# Patient Record
Sex: Female | Born: 1965 | Race: White | Hispanic: No | Marital: Married | Smoking: Never smoker
Health system: Southern US, Community
[De-identification: ages and names within clinical notes are randomized; demographics above are authoritative.]

## PROBLEM LIST (undated history)

## (undated) DIAGNOSIS — M1990 Unspecified osteoarthritis, unspecified site: Secondary | ICD-10-CM

## (undated) DIAGNOSIS — Z9889 Other specified postprocedural states: Secondary | ICD-10-CM

## (undated) DIAGNOSIS — R112 Nausea with vomiting, unspecified: Secondary | ICD-10-CM

## (undated) DIAGNOSIS — M5432 Sciatica, left side: Secondary | ICD-10-CM

## (undated) DIAGNOSIS — F419 Anxiety disorder, unspecified: Secondary | ICD-10-CM

## (undated) DIAGNOSIS — E669 Obesity, unspecified: Secondary | ICD-10-CM

## (undated) DIAGNOSIS — G5600 Carpal tunnel syndrome, unspecified upper limb: Secondary | ICD-10-CM

## (undated) DIAGNOSIS — I10 Essential (primary) hypertension: Secondary | ICD-10-CM

## (undated) HISTORY — DX: Sciatica, left side: M54.32

## (undated) HISTORY — PX: FRACTURE SURGERY: SHX138

## (undated) HISTORY — DX: Carpal tunnel syndrome, unspecified upper limb: G56.00

## (undated) HISTORY — DX: Obesity, unspecified: E66.9

## (undated) HISTORY — DX: Essential (primary) hypertension: I10

---
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
## 1993-06-02 HISTORY — PX: OTHER SURGICAL HISTORY: SHX169

## 1997-06-02 HISTORY — PX: OTHER SURGICAL HISTORY: SHX169

## 1997-12-31 ENCOUNTER — Inpatient Hospital Stay (HOSPITAL_COMMUNITY): Admission: AD | Admit: 1997-12-31 | Discharge: 1997-12-31 | Payer: Self-pay | Admitting: Obstetrics and Gynecology

## 1998-01-19 ENCOUNTER — Other Ambulatory Visit: Admission: RE | Admit: 1998-01-19 | Discharge: 1998-01-19 | Payer: Self-pay | Admitting: Obstetrics & Gynecology

## 1999-05-14 ENCOUNTER — Other Ambulatory Visit: Admission: RE | Admit: 1999-05-14 | Discharge: 1999-05-14 | Payer: Self-pay | Admitting: Obstetrics & Gynecology

## 1999-09-09 ENCOUNTER — Ambulatory Visit (HOSPITAL_COMMUNITY): Admission: RE | Admit: 1999-09-09 | Discharge: 1999-09-09 | Payer: Self-pay | Admitting: Obstetrics and Gynecology

## 1999-09-09 ENCOUNTER — Encounter: Payer: Self-pay | Admitting: Obstetrics and Gynecology

## 1999-09-15 ENCOUNTER — Inpatient Hospital Stay (HOSPITAL_COMMUNITY): Admission: AD | Admit: 1999-09-15 | Discharge: 1999-09-15 | Payer: Self-pay | Admitting: Obstetrics and Gynecology

## 1999-11-29 ENCOUNTER — Inpatient Hospital Stay (HOSPITAL_COMMUNITY): Admission: AD | Admit: 1999-11-29 | Discharge: 1999-12-02 | Payer: Self-pay | Admitting: Obstetrics & Gynecology

## 1999-12-06 ENCOUNTER — Encounter: Admission: RE | Admit: 1999-12-06 | Discharge: 1999-12-31 | Payer: Self-pay | Admitting: Obstetrics & Gynecology

## 2000-01-02 ENCOUNTER — Other Ambulatory Visit: Admission: RE | Admit: 2000-01-02 | Discharge: 2000-01-02 | Payer: Self-pay | Admitting: Obstetrics & Gynecology

## 2002-06-02 HISTORY — PX: VAGINAL HYSTERECTOMY: SUR661

## 2002-09-15 ENCOUNTER — Ambulatory Visit (HOSPITAL_COMMUNITY): Admission: RE | Admit: 2002-09-15 | Discharge: 2002-09-15 | Payer: Self-pay | Admitting: Obstetrics & Gynecology

## 2003-03-20 ENCOUNTER — Other Ambulatory Visit: Admission: RE | Admit: 2003-03-20 | Discharge: 2003-03-20 | Payer: Self-pay | Admitting: Obstetrics & Gynecology

## 2003-06-15 ENCOUNTER — Observation Stay (HOSPITAL_COMMUNITY): Admission: RE | Admit: 2003-06-15 | Discharge: 2003-06-16 | Payer: Self-pay | Admitting: Obstetrics & Gynecology

## 2004-11-26 ENCOUNTER — Other Ambulatory Visit: Admission: RE | Admit: 2004-11-26 | Discharge: 2004-11-26 | Payer: Self-pay | Admitting: Obstetrics & Gynecology

## 2005-09-16 ENCOUNTER — Ambulatory Visit: Payer: Self-pay

## 2006-06-02 HISTORY — PX: CARPAL TUNNEL RELEASE: SHX101

## 2012-04-23 ENCOUNTER — Ambulatory Visit (INDEPENDENT_AMBULATORY_CARE_PROVIDER_SITE_OTHER): Payer: 59 | Admitting: Internal Medicine

## 2012-04-23 ENCOUNTER — Encounter: Payer: Self-pay | Admitting: Internal Medicine

## 2012-04-23 VITALS — BP 157/94 | HR 77 | Temp 98.7°F | Ht 67.0 in | Wt 299.0 lb

## 2012-04-23 DIAGNOSIS — R5383 Other fatigue: Secondary | ICD-10-CM

## 2012-04-23 DIAGNOSIS — R5381 Other malaise: Secondary | ICD-10-CM

## 2012-04-23 DIAGNOSIS — Z23 Encounter for immunization: Secondary | ICD-10-CM

## 2012-04-23 DIAGNOSIS — I10 Essential (primary) hypertension: Secondary | ICD-10-CM

## 2012-04-23 DIAGNOSIS — Z139 Encounter for screening, unspecified: Secondary | ICD-10-CM

## 2012-04-23 LAB — TSH: TSH: 1.11 u[IU]/mL (ref 0.35–5.50)

## 2012-04-23 LAB — COMPREHENSIVE METABOLIC PANEL
ALT: 17 U/L (ref 0–35)
AST: 22 U/L (ref 0–37)
Albumin: 4.3 g/dL (ref 3.5–5.2)
Alkaline Phosphatase: 70 U/L (ref 39–117)
BUN: 17 mg/dL (ref 6–23)
CO2: 28 mEq/L (ref 19–32)
Calcium: 9.3 mg/dL (ref 8.4–10.5)
Chloride: 99 mEq/L (ref 96–112)
Creatinine, Ser: 0.9 mg/dL (ref 0.4–1.2)
GFR: 75.58 mL/min (ref >60.00)
Glucose, Bld: 84 mg/dL (ref 70–99)
Potassium: 3.2 mEq/L — ABNORMAL LOW (ref 3.5–5.1)
Sodium: 138 mEq/L (ref 135–145)
Total Bilirubin: 0.6 mg/dL (ref 0.3–1.2)
Total Protein: 8 g/dL (ref 6.0–8.3)

## 2012-04-23 LAB — CBC WITH DIFFERENTIAL/PLATELET
Basophils Absolute: 0.1 10*3/uL (ref 0.0–0.1)
Basophils Relative: 0.8 % (ref 0.0–3.0)
Eosinophils Absolute: 0.5 10*3/uL (ref 0.0–0.7)
Eosinophils Relative: 6.1 % — ABNORMAL HIGH (ref 0.0–5.0)
HCT: 39.7 % (ref 36.0–46.0)
Hemoglobin: 12.9 g/dL (ref 12.0–15.0)
Lymphocytes Relative: 31.2 % (ref 12.0–46.0)
Lymphs Abs: 2.5 10*3/uL (ref 0.7–4.0)
MCHC: 32.5 g/dL (ref 30.0–36.0)
MCV: 86.1 fl (ref 78.0–100.0)
Monocytes Absolute: 0.3 10*3/uL (ref 0.1–1.0)
Monocytes Relative: 3.8 % (ref 3.0–12.0)
Neutro Abs: 4.7 10*3/uL (ref 1.4–7.7)
Neutrophils Relative %: 58.1 % (ref 43.0–77.0)
Platelets: 282 10*3/uL (ref 150.0–400.0)
RBC: 4.61 Mil/uL (ref 3.87–5.11)
RDW: 14.1 % (ref 11.5–14.6)
WBC: 8.1 10*3/uL (ref 4.5–10.5)

## 2012-04-23 LAB — LIPID PANEL
Cholesterol: 207 mg/dL — ABNORMAL HIGH (ref 0–200)
HDL: 44.7 mg/dL (ref >39.00)
Total CHOL/HDL Ratio: 5
Triglycerides: 130 mg/dL (ref 0.0–149.0)
VLDL: 26 mg/dL (ref 0.0–40.0)

## 2012-04-23 LAB — LDL CHOLESTEROL, DIRECT: Direct LDL: 147.8 mg/dL

## 2012-04-23 MED ORDER — HYDROCHLOROTHIAZIDE 25 MG PO TABS: 25.0000 mg | ORAL_TABLET | Freq: Every day | ORAL | Status: DC

## 2012-04-23 NOTE — Patient Instructions (Addendum)
It was nice meeting you today.  I am going to have you continue to take the HCTZ for now.  We will monitor your blood pressure.  Send me in some readings over the next few weeks.  Let me know if any problems.

## 2012-04-24 ENCOUNTER — Encounter: Payer: Self-pay | Admitting: Internal Medicine

## 2012-04-24 DIAGNOSIS — I10 Essential (primary) hypertension: Secondary | ICD-10-CM | POA: Insufficient documentation

## 2012-04-24 NOTE — Progress Notes (Signed)
  Subjective:    Patient ID: Jenny Stephenson, female    DOB: 05/13/1966, 46 y.o.   MRN: [id number]  HPI 46 year old female with past history of hypertension (recently diagnosed) who comes in today to follow up regarding her blood pressure and to establish care.  She gets her GYN care at [facility].  Up to date with her pelvic and pap smears.  Sees Dr. Varney Baas.  Due to follow up in 1/13.  Has had problems with her blood pressure.  Borderline for a while.  Recently noticed some swelling in her fingers.  Went to acute care.  Blood pressure 160/98.  Recheck prior to leaving 148/90. Labs ok.  Started on HCTZ.  She took it regularly initially, but has subsequently stopped if for a while.  She restarted it approximately one week ago.  Blood pressures recently averaging 120s/70s.    Past Medical History  Diagnosis Date  . Hypertension     Review of Systems Patient denies any headache, lightheadedness or dizziness.  No sinus or allergy sympoms.  No chest pain or tightness.  Occasional palpitations.  Relates it to increased caffeine.  Has cut down.  No associated symptoms.   No increased shortness of breath, cough or congestion.  No nausea or vomiting.  No abdominal pain or cramping.  No bowel change, such as diarrhea, constipation, BRBPR or melana.  No urine change.   Was told at birth she had a slight murmur.  Had an ECHO previously.  States everything checked out fine.  Not sleeping well.   Increased stress with some family medical issues.  Discussed at length with her.  Discussed medication and counseling.       Objective:   Physical Exam Filed Vitals:   04/23/12 0927  BP: 157/94  Pulse: 77  Temp: 98.7 F (51.46 C)   46 year old female in no acute distress.   HEENT:  Nares - clear.  OP- without lesions or erythema.  NECK:  Supple, nontender.  No audible bruit.   HEART:  Appears to be regular.  I/VI systolic murmur.   LUNGS:  Without crackles or wheezing audible.  Respirations even  and unlabored.   RADIAL PULSE:  Equal bilaterally.  ABDOMEN:  Soft, nontender.  No audible abdominal bruit.   EXTREMITIES:  No increased edema to be present.                     Assessment & Plan:  DIFFICULTY SLEEPING.  Discussed with her at length.  Increased stress and worrying.  States her mind doesn't shut down.  Discussed using Prozac daily.  She has taken this previously and tolerated. Discussed coming off the Benadryl.  She declines the Prozac at this time.  Wants to monitor. Will follow.  Discussed counseling.  Declines.  Does not feel she needs anything more at this time.   FATIGUE.  May be multifactorial.  Check cbc,met c and tsh.    GYN.  Sees Dr.  Varney Baas at [facility].  Up to date.   HEALTH MAINTENANCE.  Up to date with her gyn exams, breast exams and mammograms.  Due to see him in 1/14.  Discussed need for colonoscopy given her mothers history of polyps.  She will notify me when agreeable.  Check lipid profile.

## 2012-04-24 NOTE — Assessment & Plan Note (Signed)
Blood pressure as outlined.  Continue HCTZ.  Check metabolic panel.  Her cuff correlates.  Have her spot check her pressure.

## 2012-04-28 ENCOUNTER — Telehealth: Payer: Self-pay | Admitting: Internal Medicine

## 2012-04-28 DIAGNOSIS — E876 Hypokalemia: Secondary | ICD-10-CM

## 2012-04-28 MED ORDER — TRIAMTERENE-HCTZ 37.5-25 MG PO TABS: 1.0000 | ORAL_TABLET | Freq: Every day | ORAL | Status: DC

## 2012-04-28 NOTE — Telephone Encounter (Signed)
Pt was notified of labs.  Her potassium was low.  Will stop hctz and start triam/hctz 37.5/25 q day.  Recheck potassium 05/10/12.  Also will work on low chol diet and exercise. Will follow.

## 2012-05-10 ENCOUNTER — Other Ambulatory Visit: Payer: 59

## 2012-06-03 ENCOUNTER — Other Ambulatory Visit (INDEPENDENT_AMBULATORY_CARE_PROVIDER_SITE_OTHER): Payer: 59

## 2012-06-03 DIAGNOSIS — E876 Hypokalemia: Secondary | ICD-10-CM

## 2012-06-03 LAB — POTASSIUM: Potassium: 4 mEq/L (ref 3.5–5.1)

## 2012-06-04 ENCOUNTER — Telehealth: Payer: Self-pay | Admitting: Internal Medicine

## 2012-06-04 NOTE — Telephone Encounter (Signed)
Pt notified of lab results via my chart messaging.

## 2012-06-07 ENCOUNTER — Ambulatory Visit (INDEPENDENT_AMBULATORY_CARE_PROVIDER_SITE_OTHER): Payer: 59 | Admitting: Internal Medicine

## 2012-06-07 ENCOUNTER — Encounter: Payer: Self-pay | Admitting: Internal Medicine

## 2012-06-07 ENCOUNTER — Other Ambulatory Visit: Payer: 59

## 2012-06-07 VITALS — BP 138/84 | HR 87 | Temp 99.0°F | Ht 67.0 in | Wt 293.8 lb

## 2012-06-07 DIAGNOSIS — E7800 Pure hypercholesterolemia, unspecified: Secondary | ICD-10-CM | POA: Insufficient documentation

## 2012-06-07 DIAGNOSIS — I10 Essential (primary) hypertension: Secondary | ICD-10-CM

## 2012-06-10 ENCOUNTER — Encounter: Payer: Self-pay | Admitting: Internal Medicine

## 2012-06-11 ENCOUNTER — Telehealth: Payer: Self-pay | Admitting: Internal Medicine

## 2012-06-11 MED ORDER — AMOXICILLIN 875 MG PO TABS: 875.0000 mg | ORAL_TABLET | Freq: Two times a day (BID) | ORAL | Status: DC

## 2012-06-11 NOTE — Telephone Encounter (Signed)
Confirm no abx allergies.  Confirm she can take penicillin. If she can and has no abx - then amoxicillin 875mg  bid x 10 days.  Let me know if persistent problems.

## 2012-06-11 NOTE — Telephone Encounter (Signed)
Called prescription in to pharmacy 

## 2012-06-13 ENCOUNTER — Encounter: Payer: Self-pay | Admitting: Internal Medicine

## 2012-06-13 NOTE — Assessment & Plan Note (Signed)
Since blood pressure has done well off medication, will have her stop the triam/hctz and follow her pressures.  If it starts to elevate - she is to notify me.  Any problems, let me know.

## 2012-06-13 NOTE — Progress Notes (Signed)
  Subjective:    Patient ID: Jenny Stephenson, female    DOB: 19-Aug-1965, 47 y.o.   MRN: [id number]  HPI 47 year old female with past history of hypertension and hypercholesterolemia who comes in today for a scheduled follow up.  She states she is doing well.  She does report that starting within the last 24 hours - she noticed some sore throat and minimal headache.  Some head congestion.  Started taking Zyrtec.  No cough or chest congestion.  Her blood pressure has been doing well.   She stopped her medication and blood pressure stayed in the 116-125/70s range.  Her cuff does correlate.  Overall she has been doing well.  Adjusting her diet.    Past Medical History  Diagnosis Date  . Hypertension     Review of Systems Patient denies any headache, lightheadedness or dizziness.  Some congestion and sore throat as outlined.   No chest pain, tightness or palpitations.  No increased shortness of breath, cough or congestion.  No nausea or vomiting.  No abdominal pain or cramping.  No bowel change, such as diarrhea, constipation, BRBPR or melana.  No urine change.        Objective:   Physical Exam Filed Vitals:   06/07/12 1032  BP: 138/84  Pulse: 87  Temp: 99 F (37.2 C)   Pulse 75  47 year old female in no acute distress.   HEENT:  Nares - clear.  OP- without lesions or erythema.  NECK:  Supple, nontender.  No audible bruit.   HEART:  Appears to be regular. LUNGS:  Without crackles or wheezing audible.  Respirations even and unlabored.   RADIAL PULSE:  Equal bilaterally.  ABDOMEN:  Soft, nontender.  No audible abdominal bruit.   EXTREMITIES:  No increased edema to be present.                    Assessment & Plan:  PROBABLE ALLERGIES.  Treat with saline nasal spray as directed.  Plain robitussin as directed.  Notify me if any worsening symptoms or problems.    HEALTH MAINTENANCE.  See last note.  Sees GYN.

## 2012-06-13 NOTE — Assessment & Plan Note (Signed)
Working on diet, exercise and weight loss.  Follow.  Check lipid panel prior to next appt.

## 2012-09-27 ENCOUNTER — Other Ambulatory Visit: Payer: 59

## 2012-10-04 ENCOUNTER — Ambulatory Visit: Payer: 59 | Admitting: Internal Medicine

## 2013-04-07 ENCOUNTER — Other Ambulatory Visit: Payer: Self-pay

## 2013-09-15 ENCOUNTER — Encounter: Payer: Self-pay | Admitting: *Deleted

## 2013-12-20 ENCOUNTER — Telehealth: Payer: Self-pay | Admitting: Internal Medicine

## 2013-12-20 NOTE — Telephone Encounter (Signed)
Pt called to schedule appt with Dr. Nicki Reaper for chest discomfort. Dr. Nicki Reaper didn't have any available appts today. Pt was given the option of being seen by Raquel. Pt stated that she only wants to see Dr. Nicki Reaper and she doesn't see why each time she calls to schedule an appt that Dr. Nicki Reaper doesn't have any available appts. Pt was ask to hold on so she can speak with the triage to see if something could be worked out. Pt hung up/msn

## 2013-12-20 NOTE — Telephone Encounter (Signed)
Spoke with pt, she has already been seen in the Urgent Care.

## 2013-12-20 NOTE — Telephone Encounter (Signed)
Please advise 

## 2013-12-20 NOTE — Telephone Encounter (Signed)
Please see Dr Nicki Reaper' s note and call pt.  Thank you

## 2013-12-20 NOTE — Telephone Encounter (Signed)
I reviewed pts chart.  She established care with me in 11/13 and had a f/u appt with me in 1/14.  No showed for her next appt on 5/14.  There is no documentation that I can find of her trying to get an appt with me and being told she could not be seen.  If she is having chest pain/discomfort, I do think she needs to be seen today.  I recommend (pending symptoms) acute care or ER where stat labs can be done if needed.  Then can f/u with me .    Need to confirm nothing more acute going on.  Thanks.

## 2014-02-27 ENCOUNTER — Ambulatory Visit (INDEPENDENT_AMBULATORY_CARE_PROVIDER_SITE_OTHER): Payer: 59 | Admitting: Internal Medicine

## 2014-02-27 ENCOUNTER — Encounter: Payer: Self-pay | Admitting: Internal Medicine

## 2014-02-27 VITALS — BP 128/80 | HR 69 | Temp 98.0°F | Ht 67.0 in | Wt 299.5 lb

## 2014-02-27 DIAGNOSIS — F411 Generalized anxiety disorder: Secondary | ICD-10-CM

## 2014-02-27 DIAGNOSIS — F419 Anxiety disorder, unspecified: Secondary | ICD-10-CM

## 2014-02-27 DIAGNOSIS — R209 Unspecified disturbances of skin sensation: Secondary | ICD-10-CM

## 2014-02-27 DIAGNOSIS — I10 Essential (primary) hypertension: Secondary | ICD-10-CM

## 2014-02-27 DIAGNOSIS — R200 Anesthesia of skin: Secondary | ICD-10-CM | POA: Insufficient documentation

## 2014-02-27 DIAGNOSIS — E7800 Pure hypercholesterolemia, unspecified: Secondary | ICD-10-CM

## 2014-02-27 LAB — COMPREHENSIVE METABOLIC PANEL
ALBUMIN: 3.9 g/dL (ref 3.5–5.2)
ALT: 27 U/L (ref 0–35)
AST: 21 U/L (ref 0–37)
Alkaline Phosphatase: 66 U/L (ref 39–117)
BUN: 11 mg/dL (ref 6–23)
CALCIUM: 8.5 mg/dL (ref 8.4–10.5)
CHLORIDE: 101 meq/L (ref 96–112)
CO2: 25 meq/L (ref 19–32)
CREATININE: 0.6 mg/dL (ref 0.4–1.2)
GFR: 105.43 mL/min (ref >60.00)
GLUCOSE: 88 mg/dL (ref 70–99)
POTASSIUM: 3.5 meq/L (ref 3.5–5.1)
Sodium: 134 mEq/L — ABNORMAL LOW (ref 135–145)
Total Bilirubin: 0.7 mg/dL (ref 0.2–1.2)
Total Protein: 6.7 g/dL (ref 6.0–8.3)

## 2014-02-27 LAB — LIPID PANEL
CHOLESTEROL: 216 mg/dL — AB (ref 0–200)
HDL: 46 mg/dL (ref >39.00)
LDL CALC: 144 mg/dL — AB (ref 0–99)
NonHDL: 170
TRIGLYCERIDES: 131 mg/dL (ref 0.0–149.0)
Total CHOL/HDL Ratio: 5
VLDL: 26.2 mg/dL (ref 0.0–40.0)

## 2014-02-27 LAB — CBC WITH DIFFERENTIAL/PLATELET
BASOS PCT: 0.3 % (ref 0.0–3.0)
Basophils Absolute: 0 10*3/uL (ref 0.0–0.1)
EOS PCT: 4.4 % (ref 0.0–5.0)
Eosinophils Absolute: 0.4 10*3/uL (ref 0.0–0.7)
HCT: 37.8 % (ref 36.0–46.0)
HEMOGLOBIN: 12.4 g/dL (ref 12.0–15.0)
LYMPHS PCT: 29.8 % (ref 12.0–46.0)
Lymphs Abs: 2.5 10*3/uL (ref 0.7–4.0)
MCHC: 32.9 g/dL (ref 30.0–36.0)
MCV: 84.8 fl (ref 78.0–100.0)
MONOS PCT: 5.6 % (ref 3.0–12.0)
Monocytes Absolute: 0.5 10*3/uL (ref 0.1–1.0)
NEUTROS ABS: 5.1 10*3/uL (ref 1.4–7.7)
NEUTROS PCT: 59.9 % (ref 43.0–77.0)
Platelets: 258 10*3/uL (ref 150.0–400.0)
RBC: 4.45 Mil/uL (ref 3.87–5.11)
RDW: 14.7 % (ref 11.5–15.5)
WBC: 8.4 10*3/uL (ref 4.0–10.5)

## 2014-02-27 LAB — VITAMIN B12: VITAMIN B 12: 315 pg/mL (ref 211–911)

## 2014-02-27 LAB — TSH: TSH: 3.07 u[IU]/mL (ref 0.35–4.50)

## 2014-02-27 MED ORDER — ALPRAZOLAM 0.25 MG PO TABS: 0.2500 mg | ORAL_TABLET | Freq: Two times a day (BID) | ORAL | Status: DC | PRN

## 2014-02-27 NOTE — Progress Notes (Signed)
  Subjective:    Patient ID: Jenny Stephenson, female    DOB: January 24, 1966, 48 y.o.   MRN: [id number]  HPI 48 year old female with past history of hypertension and hypercholesterolemia who comes in today as a work in with concerns regarding some burning in her feet and anxiety issues.  She states that starting one month ago she developed low back pain.  Went to acute care and was diagnosed with sciatica.  At that time was having pain in her low back and left leg.  Was given prednisone, tramadol and gabapentin.  She took the prednisone.  Helped some.  Has been taking the tramadol prn.  Not taking the gabapentin.  Given persistent problems, she went to the chiropractor.  MRI ordered and revealed degenerative disc disease.  She reports that starting last week, noticed increased burning in her feet (bottom of her feet and toes).  The discomfort in her hips and upper legs is worse with sitting. No headaches.  No dizziness.  No vision change.  Increased stress related to this.  She is very anxious.  Concerned she has MS.  Not sleeping well.     Past Medical History  Diagnosis Date  . Hypertension     Outpatient Encounter Prescriptions as of 02/27/2014  Medication Sig  . diphenhydrAMINE (BENADRYL) 25 mg capsule Take 25 mg by mouth at bedtime as needed.   . traMADol (ULTRAM) 50 MG tablet Take by mouth every 6 (six) hours as needed.  . ALPRAZolam (XANAX) 0.25 MG tablet Take 1 tablet (0.25 mg total) by mouth 2 (two) times daily as needed for anxiety.  . [DISCONTINUED] amoxicillin (AMOXIL) 875 MG tablet Take 1 tablet (875 mg total) by mouth 2 (two) times daily.    Review of Systems Patient denies any headache, lightheadedness or dizziness.  No sinus congestion.  No chest pain, tightness or palpitations.  No increased shortness of breath, cough or congestion.  No nausea or vomiting.  No abdominal pain or cramping.  Low back pain and hip pain with left leg pain as outlined.  Numbness and tingling of feet and toes  as outlined.  Increased stress and anxiety.  Not sleeping.         Objective:   Physical Exam  Filed Vitals:   02/27/14 0809  BP: 128/80  Pulse: 69  Temp: 98 F (36.7 C)   Blood pressure recheck:  27/29-9  48 year old female in no acute distress.   NECK:  Supple, nontender.  No audible bruit.   HEART:  Appears to be regular. LUNGS:  Without crackles or wheezing audible.  Respirations even and unlabored.   RADIAL PULSE:  Equal bilaterally.  ABDOMEN:  Soft, nontender.  No audible abdominal bruit.   EXTREMITIES:  No increased edema to be present.   NEURO:  Sensation intact to pin prick and light touch bilateral lower extremities.  Motor strength 5/5 bilateral lower extremities.  Dorsiflexion and plantar flexion intact.                    Assessment & Plan:  HEALTH MAINTENANCE.  See last note.  Sees GYN.  Up to date with breast, pelvic and pap smears.  Up to date with mammogram.    I spent 25 minutes with the patient and more than 50% if the time was spent in consultation regarding the above.

## 2014-02-27 NOTE — Progress Notes (Signed)
Pre visit review using our clinic review tool, if applicable. No additional management support is needed unless otherwise documented below in the visit note. 

## 2014-02-28 ENCOUNTER — Other Ambulatory Visit: Payer: Self-pay | Admitting: Internal Medicine

## 2014-02-28 ENCOUNTER — Encounter: Payer: Self-pay | Admitting: Internal Medicine

## 2014-02-28 ENCOUNTER — Encounter: Payer: Self-pay | Admitting: Neurology

## 2014-02-28 ENCOUNTER — Encounter: Payer: Self-pay | Admitting: *Deleted

## 2014-02-28 ENCOUNTER — Ambulatory Visit (INDEPENDENT_AMBULATORY_CARE_PROVIDER_SITE_OTHER): Payer: 59 | Admitting: Neurology

## 2014-02-28 VITALS — BP 128/90 | HR 68 | Ht 68.0 in | Wt 298.0 lb

## 2014-02-28 DIAGNOSIS — M5432 Sciatica, left side: Secondary | ICD-10-CM | POA: Insufficient documentation

## 2014-02-28 DIAGNOSIS — R209 Unspecified disturbances of skin sensation: Secondary | ICD-10-CM

## 2014-02-28 DIAGNOSIS — M5430 Sciatica, unspecified side: Secondary | ICD-10-CM

## 2014-02-28 DIAGNOSIS — F419 Anxiety disorder, unspecified: Secondary | ICD-10-CM | POA: Insufficient documentation

## 2014-02-28 DIAGNOSIS — E871 Hypo-osmolality and hyponatremia: Secondary | ICD-10-CM

## 2014-02-28 DIAGNOSIS — R200 Anesthesia of skin: Secondary | ICD-10-CM

## 2014-02-28 HISTORY — DX: Sciatica, left side: M54.32

## 2014-02-28 NOTE — Progress Notes (Signed)
Order placed for f/u sodium.  ?

## 2014-02-28 NOTE — Assessment & Plan Note (Addendum)
Back, left hip and left leg pain as outlined.  Now with burning in her feet.  Will check routine labs including B12, thyroid and cbc.  She already has an appointment scheduled with Dr Jannifer Franklin tomorrow.  We discussed possible etiologies.  Will start with routine blood work first.  She will see him tomorrow for further w/up (including possible NCS, etc).  MRI as outlined.  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  Has been to the chiropractor.  May need physical therapy.  She has gabapentin.  We discussed a trial of this to see if symptoms improve.  She prefers to discuss with neurology tomorrow prior to starting.

## 2014-02-28 NOTE — Assessment & Plan Note (Signed)
Blood pressure as outlined.  Slightly increased.  Probably related to the increased stress and anxiety.  Will follow.  Have her spot check her pressure.  Get her back in soon to reassess.  Check metabolic panel.

## 2014-02-28 NOTE — Assessment & Plan Note (Signed)
Discussed at length with her today.  Discussed various treatment options.  She has taken xanax previously and tolerated.  We discussed possible SSRI therapy.  Will start with xanax first.  rx given.  Use as directed.  Get her back in soon to reassess.  Further treatment pending response.

## 2014-02-28 NOTE — Assessment & Plan Note (Signed)
Have not seen her in more than one year.  Check cholesterol panel.  Diet and exercise.

## 2014-02-28 NOTE — Patient Instructions (Signed)
Sciatica °Sciatica is pain, weakness, numbness, or tingling along your sciatic nerve. The nerve starts in the lower back and runs down the back of each leg. Nerve damage or certain conditions pinch or put pressure on the sciatic nerve. This causes the pain, weakness, and other discomforts of sciatica. °HOME CARE  °· Only take medicine as told by your doctor. °· Apply ice to the affected area for 20 minutes. Do this 3-4 times a day for the first 48-72 hours. Then try heat in the same way. °· Exercise, stretch, or do your usual activities if these do not make your pain worse. °· Go to physical therapy as told by your doctor. °· Keep all doctor visits as told. °· Do not wear high heels or shoes that are not supportive. °· Get a firm mattress if your mattress is too soft to lessen pain and discomfort. °GET HELP RIGHT AWAY IF:  °· You cannot control when you poop (bowel movement) or pee (urinate). °· You have more weakness in your lower back, lower belly (pelvis), butt (buttocks), or legs. °· You have redness or puffiness (swelling) of your back. °· You have a burning feeling when you pee. °· You have pain that gets worse when you lie down. °· You have pain that wakes you from your sleep. °· Your pain is worse than past pain. °· Your pain lasts longer than 4 weeks. °· You are suddenly losing weight without reason. °MAKE SURE YOU:  °· Understand these instructions. °· Will watch this condition. °· Will get help right away if you are not doing well or get worse. °Document Released: 02/26/2008 Document Revised: 11/18/2011 Document Reviewed: 09/28/2011 °Exit[employer]® Patient Information ©2015 [employer]. This information is not intended to replace advice given to you by your health care provider. Make sure you discuss any questions you have with your health care provider. ° °

## 2014-02-28 NOTE — Progress Notes (Signed)
Reason for visit: Numbness  Jenny Stephenson is a 48 y.o. female  History of present illness:  Ms. Jenny Stephenson is a 5 year old right-handed white female with a history of obesity and carpal tunnel syndrome in the past. The patient indicates that she began to have spontaneous onset of back pain that began about one month ago. The pain would radiate down the left leg, and go to the fourth and fifth toes on the left foot. The patient had a burning sensation that would come and go in the foot, and she indicates that the numbness and tingling sensations go down the posterior portion of the legs. She indicates that the sensations tend to come on when she is sitting, better when she is up and moving around. She underwent chiropractic therapy, but then she began having numbness and tingling down both legs. The patient was seen by Dr. Sherwood Gambler, and MRI evaluation of the lumbosacral spine was done, and this is brought for my review. This study is relatively unremarkable, and shows mild degenerative disc disease and facet arthropathy. No evidence of nerve root compression is seen, no evidence of spinal cord abnormalities are noted. The patient denies any weakness of the extremities, and she denies any balance issues. There have been no changes in bowel or bladder control. The patient denies any sensory alteration or pain in the neck or down arms. She comes to this office for an evaluation.  Past Medical History  Diagnosis Date  . Hypertension   . Sciatica of left side 02/28/2014  . Obesity   . Carpal tunnel syndrome     bilateral    Past Surgical History  Procedure Laterality Date  . Tonsillectomy  1973  . Cesarean section  1993 and 2001  . Cholescystectomy  1995  . Arm surgery  1999    screws/plates  . Vaginal hysterectomy  2004    ovaries not removed, secondary to fibroids and bleeding  . Carpal tunnel release  2008    Family History  Problem Relation Age of Onset  . Prostate cancer Father   .  Hypertension Mother   . Ulcerative colitis Mother   . Colon polyps Mother   . Heart disease Maternal Grandmother     myocardial infarction  . Colon cancer Neg Hx   . Breast cancer Neg Hx     Social history:  reports that she has never smoked. She has never used smokeless tobacco. She reports that she does not drink alcohol or use illicit drugs.  Medications:  Current Outpatient Prescriptions on File Prior to Visit  Medication Sig Dispense Refill  . ALPRAZolam (XANAX) 0.25 MG tablet Take 1 tablet (0.25 mg total) by mouth 2 (two) times daily as needed for anxiety.  30 tablet  0  . diphenhydrAMINE (BENADRYL) 25 mg capsule Take 25 mg by mouth at bedtime as needed.       . traMADol (ULTRAM) 50 MG tablet Take by mouth every 6 (six) hours as needed.       No current facility-administered medications on file prior to visit.      Allergies  Allergen Reactions  . Codeine Itching and Other (See Comments)    Hyper    ROS: Out of a complete 14 system review of symptoms, the patient complains only of the following symptoms, and all other reviewed systems are negative.  Numbness Anxiety, insomnia, racing thoughts   Blood pressure 128/90, pulse 68, height 5\' 8"  (1.727 m), weight 298 lb (135.172 kg).  Physical Exam  General: The patient is alert and cooperative at the time of the examination. The patient is markedly obese.  Eyes: Pupils are equal, round, and reactive to light. Discs are flat bilaterally.  Neck: The neck is supple, no carotid bruits are noted.  Respiratory: The respiratory examination is clear.  Cardiovascular: The cardiovascular examination reveals a regular rate and rhythm, no obvious murmurs or rubs are noted.  Neuromuscular: Range of movement of the lumbosacral spine is full. No significant discomfort with palpation of the back is noted. Mild tenderness over the SI joints is noted bilaterally.  Skin: Extremities are without significant edema.  Neurologic  Exam  Mental status: The patient is alert and oriented x 3 at the time of the examination. The patient has apparent normal recent and remote memory, with an apparently normal attention span and concentration ability.  Cranial nerves: Facial symmetry is present. There is good sensation of the face to pinprick and soft touch bilaterally. The strength of the facial muscles and the muscles to head turning and shoulder shrug are normal bilaterally. Speech is well enunciated, no aphasia or dysarthria is noted. Extraocular movements are full. Visual fields are full. The tongue is midline, and the patient has symmetric elevation of the soft palate. No obvious hearing deficits are noted.  Motor: The motor testing reveals 5 over 5 strength of all 4 extremities. Good symmetric motor tone is noted throughout. The patient is able to walk on heels and the toes bilaterally.  Sensory: Sensory testing is intact to pinprick, soft touch, vibration sensation, and position sense on all 4 extremities. No evidence of extinction is noted.  Coordination: Cerebellar testing reveals good finger-nose-finger and heel-to-shin bilaterally.  Gait and station: Gait is normal. Tandem gait is normal. Romberg is negative. No drift is seen.  Reflexes: Deep tendon reflexes are symmetric and normal bilaterally. The ankle jerk reflexes are well-maintained bilaterally. Toes are downgoing bilaterally.   Assessment/Plan:  1. Low back pain, bilateral lower extremity discomfort  The patient has developed some back pain, and some discomfort down both legs without significant abnormalities seen on MRI of the lumbar spine. The patient will be set up for nerve conduction studies of both legs and the right arm, and EMG evaluation of both legs. If this study is unremarkable, MRI evaluation of the thoracic spine and possibly the brain may be done to exclude the possibility of demyelinating disease. The patient will followup for the EMG  evaluation.  Jill Alexanders MD 02/28/2014 7:35 PM  [facility] [address] [place], [place] [address]  Phone [phone] Fax [phone]

## 2014-03-07 ENCOUNTER — Other Ambulatory Visit (INDEPENDENT_AMBULATORY_CARE_PROVIDER_SITE_OTHER): Payer: 59

## 2014-03-07 ENCOUNTER — Encounter: Payer: Self-pay | Admitting: Internal Medicine

## 2014-03-07 DIAGNOSIS — E871 Hypo-osmolality and hyponatremia: Secondary | ICD-10-CM

## 2014-03-07 LAB — SODIUM: Sodium: 135 mEq/L (ref 135–145)

## 2014-03-08 ENCOUNTER — Encounter: Payer: Self-pay | Admitting: Internal Medicine

## 2014-03-08 ENCOUNTER — Encounter: Payer: Self-pay | Admitting: Neurology

## 2014-03-08 ENCOUNTER — Encounter (INDEPENDENT_AMBULATORY_CARE_PROVIDER_SITE_OTHER): Payer: Self-pay | Admitting: Radiology

## 2014-03-08 ENCOUNTER — Ambulatory Visit (INDEPENDENT_AMBULATORY_CARE_PROVIDER_SITE_OTHER): Payer: 59 | Admitting: Neurology

## 2014-03-08 DIAGNOSIS — M5432 Sciatica, left side: Secondary | ICD-10-CM

## 2014-03-08 DIAGNOSIS — R200 Anesthesia of skin: Secondary | ICD-10-CM

## 2014-03-08 DIAGNOSIS — Z0289 Encounter for other administrative examinations: Secondary | ICD-10-CM

## 2014-03-08 DIAGNOSIS — R208 Other disturbances of skin sensation: Secondary | ICD-10-CM

## 2014-03-08 NOTE — Procedures (Signed)
     HISTORY:  Jenny Stephenson is a 48 year old patient with a history of intermittent numbness of the lower extremities, and some low back pain. MRI evaluation of the low back was unrevealing. The patient is being evaluated for a possible neuropathy or a lumbosacral radiculopathy.  NERVE CONDUCTION STUDIES:  Nerve conduction studies were performed on the right upper extremity. The distal motor latencies and motor amplitudes for the median and ulnar nerves were within normal limits. The F wave latencies and nerve conduction velocities for these nerves were also normal. The sensory latencies for the median and ulnar nerves were normal.  Nerve conduction studies were performed on both lower extremities. The distal motor latencies and motor amplitudes for the peroneal and posterior tibial nerves were within normal limits. The nerve conduction velocities for these nerves were also normal. The F wave latencies were normal for the peroneal and posterior tibial nerves bilaterally. The sensory latencies for the peroneal nerves were within normal limits.   EMG STUDIES:  EMG study was performed on the right lower extremity:  The tibialis anterior muscle reveals 2 to 4K motor units with full recruitment. No fibrillations or positive waves were seen. The peroneus tertius muscle reveals 2 to 4K motor units with full recruitment. No fibrillations or positive waves were seen. The medial gastrocnemius muscle reveals 1 to 3K motor units with full recruitment. No fibrillations or positive waves were seen. The vastus lateralis muscle reveals 2 to 4K motor units with full recruitment. No fibrillations or positive waves were seen. The iliopsoas muscle reveals 2 to 4K motor units with full recruitment. No fibrillations or positive waves were seen. The biceps femoris muscle (long head) reveals 2 to 4K motor units with full recruitment. No fibrillations or positive waves were seen. The lumbosacral paraspinal muscles  were tested at 3 levels, and revealed no abnormalities of insertional activity at all 3 levels tested. There was good relaxation.  EMG study was performed on the left lower extremity:  The tibialis anterior muscle reveals 2 to 4K motor units with full recruitment. No fibrillations or positive waves were seen. The peroneus tertius muscle reveals 2 to 4K motor units with full recruitment. No fibrillations or positive waves were seen. The medial gastrocnemius muscle reveals 1 to 3K motor units with full recruitment. No fibrillations or positive waves were seen. The vastus lateralis muscle reveals 2 to 4K motor units with full recruitment. No fibrillations or positive waves were seen. The iliopsoas muscle reveals 2 to 4K motor units with full recruitment. No fibrillations or positive waves were seen. The biceps femoris muscle (long head) reveals 2 to 4K motor units with full recruitment. No fibrillations or positive waves were seen. The lumbosacral paraspinal muscles were tested at 3 levels, and revealed no abnormalities of insertional activity at all 3 levels tested. There was good relaxation.   IMPRESSION:  Nerve conduction studies done on the right upper extremity and both lower extremities were unremarkable. No evidence of a peripheral neuropathy is seen. EMG evaluation of both lower extremities were unremarkable, without evidence of an overlying lumbosacral radiculopathy on either side.  Jill Alexanders MD 03/08/2014 1:56 PM  [facility] [address] [place], [place] [address]  Phone [phone] Fax [phone]

## 2014-03-08 NOTE — Progress Notes (Signed)
Jenny Stephenson is a 48 year old patient with a history of obesity and a history of intermittent paresthesias involving both lower extremities associated with low back pain. The patient is being evaluated for possible radiculopathy.  Nerve conduction studies done on the right arm and both legs were normal, without evidence of a peripheral neuropathy. EMG evaluation of both legs were normal.  The patient will be sent for further evaluation, with blood work today, and she will have MRI evaluation of the thoracic spine and brain to evaluate for possible demyelinating disease. She will followup in 3 months.

## 2014-03-09 LAB — LYME, TOTAL AB TEST/REFLEX: Lyme IgG/IgM Ab: <0.91 {ISR} (ref 0.00–0.90)

## 2014-03-09 LAB — SEDIMENTATION RATE: SED RATE: 7 mm/h (ref 0–32)

## 2014-03-09 LAB — HIV ANTIBODY (ROUTINE TESTING W REFLEX)
HIV 1/HIV 2 AB: NONREACTIVE
HIV 1/O/2 Abs-Index Value: <1 (ref <1.00)

## 2014-03-09 LAB — ANGIOTENSIN CONVERTING ENZYME: Angio Convert Enzyme: 33 U/L (ref 14–82)

## 2014-03-09 LAB — RHEUMATOID FACTOR: Rhuematoid fact SerPl-aCnc: 13.5 IU/mL (ref 0.0–13.9)

## 2014-03-09 LAB — ANA W/REFLEX: ANA: NEGATIVE

## 2014-03-10 ENCOUNTER — Other Ambulatory Visit: Payer: 59

## 2014-03-15 ENCOUNTER — Ambulatory Visit (INDEPENDENT_AMBULATORY_CARE_PROVIDER_SITE_OTHER): Payer: 59

## 2014-03-15 DIAGNOSIS — R208 Other disturbances of skin sensation: Secondary | ICD-10-CM

## 2014-03-15 DIAGNOSIS — R200 Anesthesia of skin: Secondary | ICD-10-CM

## 2014-03-15 DIAGNOSIS — M5432 Sciatica, left side: Secondary | ICD-10-CM

## 2014-03-15 MED ORDER — GADOPENTETATE DIMEGLUMINE 469.01 MG/ML IV SOLN: 20.0000 mL | Freq: Once | INTRAVENOUS | Status: AC | PRN

## 2014-03-16 ENCOUNTER — Encounter: Payer: Self-pay | Admitting: Internal Medicine

## 2014-03-16 ENCOUNTER — Telehealth: Payer: Self-pay | Admitting: Neurology

## 2014-03-16 MED ORDER — TRAMADOL HCL 50 MG PO TABS: 50.0000 mg | ORAL_TABLET | Freq: Four times a day (QID) | ORAL | Status: DC | PRN

## 2014-03-16 NOTE — Telephone Encounter (Signed)
I called patient. MRI of the thoracic spine and brain were unremarkable, no evidence of spinal cord compression or evidence of demyelinating disease. The gabapentin may need to be increased over time, we will followup with the patient. The patient wants a prescription for Ultram, I will call this in.    MRI thoracic spine 03/16/2014:  Impression   Normal MRI thoracic spine (with and without).   MRI brain 03/16/2014:   IMPRESSION:  Normal MRI brain (with and without).

## 2014-03-16 NOTE — Telephone Encounter (Signed)
Called patient and scheduled appointment at 9:30 on 05/17/14 with NP MM.

## 2014-03-21 ENCOUNTER — Telehealth: Payer: Self-pay | Admitting: Internal Medicine

## 2014-03-21 NOTE — Telephone Encounter (Signed)
I can see her at 12:00 on 04/03/14.  Can also hold for cancellation.

## 2014-03-21 NOTE — Telephone Encounter (Signed)
Patient called and said she received the message asking her to come in yesterday to late to make the appointment.  She wants to know if the doctor can choose another appointment and get back to her because coming into the office in the middle of November is too late.   She needs to see the doctor before that.

## 2014-03-21 NOTE — Telephone Encounter (Signed)
See other message for work in.

## 2014-03-21 NOTE — Telephone Encounter (Signed)
This message is in response to a mychart message sent recently asking pt to come in on 10/19 @ 10:30. Pt didn't see the message until later that day. Please advise where to schedule patient

## 2014-03-27 ENCOUNTER — Ambulatory Visit (INDEPENDENT_AMBULATORY_CARE_PROVIDER_SITE_OTHER): Payer: 59 | Admitting: Internal Medicine

## 2014-03-27 ENCOUNTER — Encounter: Payer: Self-pay | Admitting: Internal Medicine

## 2014-03-27 VITALS — BP 130/90 | HR 76 | Temp 98.1°F | Ht 68.0 in | Wt 296.5 lb

## 2014-03-27 DIAGNOSIS — F419 Anxiety disorder, unspecified: Secondary | ICD-10-CM

## 2014-03-27 DIAGNOSIS — R200 Anesthesia of skin: Secondary | ICD-10-CM

## 2014-03-27 DIAGNOSIS — I10 Essential (primary) hypertension: Secondary | ICD-10-CM

## 2014-03-27 DIAGNOSIS — R208 Other disturbances of skin sensation: Secondary | ICD-10-CM

## 2014-03-27 MED ORDER — ALPRAZOLAM 0.25 MG PO TABS: 0.2500 mg | ORAL_TABLET | Freq: Every day | ORAL | Status: DC | PRN

## 2014-03-27 MED ORDER — GABAPENTIN 300 MG PO CAPS: 300.0000 mg | ORAL_CAPSULE | Freq: Three times a day (TID) | ORAL | Status: DC | PRN

## 2014-03-27 NOTE — Progress Notes (Signed)
Pre visit review using our clinic review tool, if applicable. No additional management support is needed unless otherwise documented below in the visit note. 

## 2014-03-27 NOTE — Progress Notes (Signed)
Subjective:    Patient ID: Jenny Stephenson, female    DOB: 04-30-1966, 48 y.o.   MRN: [id number]  HPI 48 year old female with past history of hypertension and hypercholesterolemia who comes in today as a work in with concerns regarding some persistent burning in her feet.  Symptoms started over one month ago.  Saw Dr Jannifer Franklin.  Had normal MRI brain, normal thoracic spine and DDD of lumbar spine.  Still anxious about the etiology.  States her back in not as bad.  Still with burning in her feet.  Feels has worsened.  Taking gabapentin.  Legs more numb when in the recliner and up.  Last week noticed her fingertips were numb.  Left arm is worse than right.  Will feel cold.  Has a lot of questions about why she is now having these symptoms.  Some neck issues.   Some pain with rotation of her head.       Past Medical History  Diagnosis Date  . Hypertension   . Sciatica of left side 02/28/2014  . Obesity   . Carpal tunnel syndrome     bilateral    Outpatient Encounter Prescriptions as of 03/27/2014  Medication Sig  . ALPRAZolam (XANAX) 0.25 MG tablet Take 1 tablet (0.25 mg total) by mouth 2 (two) times daily as needed for anxiety.  . cetirizine (ZYRTEC) 10 MG tablet Take 10 mg by mouth daily as needed.   . diphenhydrAMINE (BENADRYL) 25 mg capsule Take 25 mg by mouth at bedtime as needed.   Marland Kitchen ibuprofen (ADVIL,MOTRIN) 200 MG tablet Take 200 mg by mouth every 6 (six) hours as needed.  . traMADol (ULTRAM) 50 MG tablet Take 1 tablet (50 mg total) by mouth every 6 (six) hours as needed.  . gabapentin (NEURONTIN) 300 MG capsule Take 300 mg by mouth at bedtime.    Review of Systems Patient denies any headache, lightheadedness or dizziness.  Neck pain as outlined.  No chest pain, tightness or palpitations.  No increased shortness of breath, cough or congestion.  No nausea or vomiting.  No abdominal pain or cramping.  Low back pain and hip pain with left leg pain as outlined.  Numbness and tingling of feet  and toes as outlined.  Also, fingertips tingling and upper extremity symptoms as outlined.   Increased stress and anxiety related to these symptoms.           Objective:   Physical Exam  Filed Vitals:   03/27/14 0916  BP: 130/90  Pulse: 76  Temp: 98.1 F (57.59 C)   48 year old female in no acute distress.   NECK:  Supple, nontender.  No audible bruit.   HEART:  Appears to be regular. LUNGS:  Without crackles or wheezing audible.  Respirations even and unlabored.   RADIAL PULSE:  Equal bilaterally.  ABDOMEN:  Soft, nontender.  No audible abdominal bruit.   EXTREMITIES:  No increased edema to be present.   NEURO:  No focal abnormality noted.  See last note.                    Assessment & Plan:  1. Essential hypertension Blood pressure slightly elevated today.  Follow.  Feel related to the above.    2. Numbness in feet Unclear etiology.  The burning is worse.  Left upper extremity bothers her more than right upper extremity.  Seeing neurology.  Has had MRI brain, MRI thoracic and L-S spine.  Had normal NCS.  Will contact Dr Jannifer Franklin to see what further w/up warranted. Pt questioning need of C-spine MRI.    3. Anxiety Increased stress and anxiety related to above.  Refilled xanax.  Follow.    HEALTH MAINTENANCE.  See previous notes.  Sees GYN.  Up to date with breast, pelvic and pap smears.  Up to date with mammogram.    I spent 25 minutes with the patient and more than 50% if the time was spent in consultation regarding the above.

## 2014-04-02 ENCOUNTER — Encounter: Payer: Self-pay | Admitting: Internal Medicine

## 2014-04-03 ENCOUNTER — Ambulatory Visit: Payer: 59 | Admitting: Internal Medicine

## 2014-04-05 ENCOUNTER — Telehealth: Payer: Self-pay | Admitting: Neurology

## 2014-04-05 ENCOUNTER — Encounter: Payer: Self-pay | Admitting: Internal Medicine

## 2014-04-05 NOTE — Telephone Encounter (Signed)
The patient continues to complain of left greater than right upper extremity and bilateral lower extremity paresthesias and discomfort. The patient is having some neck pain. MRI of the brain, and thoracic spine was unremarkable. Dr. Aquilla Hacker obtain MRI of the cervical spine. The patient will follow-up through this office. Blood work was unremarkable.

## 2014-04-12 ENCOUNTER — Ambulatory Visit (INDEPENDENT_AMBULATORY_CARE_PROVIDER_SITE_OTHER): Payer: 59

## 2014-04-12 ENCOUNTER — Ambulatory Visit (INDEPENDENT_AMBULATORY_CARE_PROVIDER_SITE_OTHER): Payer: 59 | Admitting: Podiatry

## 2014-04-12 ENCOUNTER — Encounter: Payer: Self-pay | Admitting: Podiatry

## 2014-04-12 ENCOUNTER — Ambulatory Visit: Payer: Self-pay | Admitting: Internal Medicine

## 2014-04-12 VITALS — BP 141/85 | HR 68 | Resp 16 | Ht 68.0 in | Wt 290.0 lb

## 2014-04-12 DIAGNOSIS — R202 Paresthesia of skin: Secondary | ICD-10-CM

## 2014-04-12 DIAGNOSIS — G629 Polyneuropathy, unspecified: Secondary | ICD-10-CM

## 2014-04-12 DIAGNOSIS — M5430 Sciatica, unspecified side: Secondary | ICD-10-CM

## 2014-04-12 NOTE — Progress Notes (Signed)
   Subjective:    Patient ID: Jenny Stephenson, female    DOB: 22-Feb-1966, 48 y.o.   MRN: [id number]  HPI Comments: i have burning in both feet on the bottom and it started in September. The left foot is worse. i can be sitting and they start burning and if i change position they will get better. In the last week my feet have gotten better. i am taking gabapentin and its helping a little bit. i got dizzy with it over the weekend and i havent taken it since then. i take ibuprofen and tylenol.  Foot Pain Associated symptoms include numbness.      Review of Systems  Musculoskeletal: Positive for back pain.  Neurological: Positive for dizziness and numbness.  Psychiatric/Behavioral: The patient is nervous/anxious.   All other systems reviewed and are negative.      Objective:   Physical Exam:I have reviewed her past history medications allergies surgery social history and review of systems. 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 A history of sciatica. She has had a complete workup neurosurgery to neurology and primary care. States the only time her feet felt better while she was on a steroid which also improved her back pain and her sciatica. Recently she has taken herself off the gabapentin due to side effects.  Pulses are strongly palpable bilateral. Neurologic sensorium is intact per Semmes-Weinstein monofilament. Deep tendon reflexes are intact bilateral and muscle strength +5 over 5 dorsiflexion plantar flexors and inverters and everters all intrinsic musculature is intact. Orthopedic evaluation demonstrates all joints distal to the ankle and full range of motion without crepitation. Cutaneous evaluation demonstrates supple well-hydrated cutis no erythema edema cellulitis drainage or odor.        Assessment & Plan:  Assessment: No reproducible symptoms to the bilateral foot. 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  Plan: I  encouraged her to start back on her gabapentin. And I am referring her to Dr.Gregory Primus Bravo for evaluation of her spine and chronic sciatic pain. Treating this should resolve the burning foot issues. Otherwise long-term prednisone therapy converted to nonsteroidals will be the next step along with a nerve biopsy of the bilateral lower extremities.

## 2014-04-21 ENCOUNTER — Ambulatory Visit: Payer: 59 | Admitting: Internal Medicine

## 2014-04-26 DIAGNOSIS — H8111 Benign paroxysmal vertigo, right ear: Secondary | ICD-10-CM | POA: Insufficient documentation

## 2014-04-26 DIAGNOSIS — H9319 Tinnitus, unspecified ear: Secondary | ICD-10-CM | POA: Insufficient documentation

## 2014-05-17 ENCOUNTER — Ambulatory Visit: Payer: Self-pay | Admitting: Adult Health

## 2014-05-26 ENCOUNTER — Telehealth: Payer: 59 | Admitting: Family

## 2014-05-26 DIAGNOSIS — R059 Cough, unspecified: Secondary | ICD-10-CM

## 2014-05-26 DIAGNOSIS — R05 Cough: Secondary | ICD-10-CM

## 2014-05-26 DIAGNOSIS — J069 Acute upper respiratory infection, unspecified: Secondary | ICD-10-CM

## 2014-05-26 MED ORDER — AZITHROMYCIN 250 MG PO TABS: ORAL_TABLET | ORAL | Status: DC

## 2014-05-26 MED ORDER — BENZONATATE 200 MG PO CAPS: 200.0000 mg | ORAL_CAPSULE | Freq: Three times a day (TID) | ORAL | Status: DC | PRN

## 2014-05-26 NOTE — Progress Notes (Signed)
We are sorry that you are not feeling well.  Here is how we plan to help!  Based on what you have shared with me it looks like you have upper respiratory tract inflammation that has resulted in a signification cough.  Inflammation and infection in the upper respiratory tract is commonly called bronchitis and has four common causes:  Allergies, Viral Infections, Acid Reflux and Bacterial Infections.  Allergies, viruses and acid reflux are treated by controlling symptoms or eliminating the cause. An example might be a cough caused by taking certain blood pressure medications. You stop the cough by changing the medication. Another example might be a cough caused by acid reflux. Controlling the reflux helps control the cough.  Based on your presentation I believe you most likely have A cough due to bacteria.  When patients have a fever and a productive cough with a change in color or increased sputum production, we are concerned about bacterial bronchitis.  If left untreated it can progress to pneumonia.  If your symptoms do not improve with your treatment plan it is important that you contact your provider.   I have prescribed Azithromyin 250 mg: two tables now and then one tablet daily for 4 additonal days   In addition you may use A non-prescription cough medication called Robitussin DAC. Take 2 teaspoons every 8 hours or Delsym: take 2 teaspoons every 12 hours., A non-prescription cough medication called Mucinex DM: take 2 tablets every 12 hours. and A prescription cough medication called Tessalon Perles 100mg. You may take 1-2 capsules every 8 hours as needed for your cough.    HOME CARE . Only take medications as instructed by your medical team. . Complete the entire course of an antibiotic. . Drink plenty of fluids and get plenty of rest. . Avoid close contacts especially the very young and the elderly . Cover your mouth if you cough or cough into your sleeve. . Always remember to wash your  hands . A steam or ultrasonic humidifier can help congestion.    GET HELP RIGHT AWAY IF: . You develop worsening fever. . You become short of breath . You cough up blood. . Your symptoms persist after you have completed your treatment plan MAKE SURE YOU   Understand these instructions.  Will watch your condition.  Will get help right away if you are not doing well or get worse.  Your e-visit answers were reviewed by a board certified advanced clinical practitioner to complete your personal care plan.  Depending on the condition, your plan could have included both over the counter or prescription medications.  If there is a problem please reply  once you have received a response from your provider.  Your safety is important to us.  If you have drug allergies check your prescription carefully.    You can use MyChart to ask questions about today's visit, request a non-urgent call back, or ask for a work or school excuse.  You will get an e-mail in the next two days asking about your experience.  I hope that your e-visit has been valuable and will speed your recovery. Thank you for using e-visits.   

## 2014-06-06 DIAGNOSIS — H66003 Acute suppurative otitis media without spontaneous rupture of ear drum, bilateral: Secondary | ICD-10-CM | POA: Insufficient documentation

## 2014-06-12 ENCOUNTER — Telehealth: Payer: Self-pay | Admitting: *Deleted

## 2014-06-12 NOTE — Telephone Encounter (Signed)
calld dr crisp office spoke with [facility]. She stated the pt was called and they left a message for pt to call them back to schedule an appt. Pt never called back to schedule.

## 2014-09-13 ENCOUNTER — Other Ambulatory Visit: Payer: Self-pay

## 2014-09-14 LAB — CYTOLOGY - PAP

## 2015-12-03 DIAGNOSIS — R062 Wheezing: Secondary | ICD-10-CM | POA: Insufficient documentation

## 2015-12-13 ENCOUNTER — Encounter: Payer: Self-pay | Admitting: Internal Medicine

## 2015-12-13 ENCOUNTER — Ambulatory Visit (INDEPENDENT_AMBULATORY_CARE_PROVIDER_SITE_OTHER): Payer: 59 | Admitting: Internal Medicine

## 2015-12-13 ENCOUNTER — Ambulatory Visit (INDEPENDENT_AMBULATORY_CARE_PROVIDER_SITE_OTHER): Payer: 59

## 2015-12-13 VITALS — BP 130/90 | HR 88 | Temp 98.2°F | Resp 17 | Ht 68.0 in | Wt 301.2 lb

## 2015-12-13 DIAGNOSIS — R062 Wheezing: Secondary | ICD-10-CM

## 2015-12-13 DIAGNOSIS — I10 Essential (primary) hypertension: Secondary | ICD-10-CM | POA: Diagnosis not present

## 2015-12-13 DIAGNOSIS — R05 Cough: Secondary | ICD-10-CM

## 2015-12-13 DIAGNOSIS — F419 Anxiety disorder, unspecified: Secondary | ICD-10-CM | POA: Diagnosis not present

## 2015-12-13 DIAGNOSIS — R059 Cough, unspecified: Secondary | ICD-10-CM

## 2015-12-13 DIAGNOSIS — K219 Gastro-esophageal reflux disease without esophagitis: Secondary | ICD-10-CM

## 2015-12-13 DIAGNOSIS — E7800 Pure hypercholesterolemia, unspecified: Secondary | ICD-10-CM | POA: Diagnosis not present

## 2015-12-13 DIAGNOSIS — Z1239 Encounter for other screening for malignant neoplasm of breast: Secondary | ICD-10-CM

## 2015-12-13 LAB — CBC WITH DIFFERENTIAL/PLATELET
BASOS PCT: 0.5 % (ref 0.0–3.0)
Basophils Absolute: 0.1 10*3/uL (ref 0.0–0.1)
EOS PCT: 4.7 % (ref 0.0–5.0)
Eosinophils Absolute: 0.6 10*3/uL (ref 0.0–0.7)
HCT: 44.1 % (ref 36.0–46.0)
Hemoglobin: 14.6 g/dL (ref 12.0–15.0)
LYMPHS ABS: 4 10*3/uL (ref 0.7–4.0)
Lymphocytes Relative: 31.9 % (ref 12.0–46.0)
MCHC: 33.2 g/dL (ref 30.0–36.0)
MCV: 82.5 fl (ref 78.0–100.0)
MONO ABS: 0.8 10*3/uL (ref 0.1–1.0)
MONOS PCT: 6.3 % (ref 3.0–12.0)
NEUTROS PCT: 56.6 % (ref 43.0–77.0)
Neutro Abs: 7 10*3/uL (ref 1.4–7.7)
Platelets: 351 10*3/uL (ref 150.0–400.0)
RBC: 5.34 Mil/uL — AB (ref 3.87–5.11)
RDW: 14.8 % (ref 11.5–15.5)
WBC: 12.4 10*3/uL — ABNORMAL HIGH (ref 4.0–10.5)

## 2015-12-13 LAB — VITAMIN B12: VITAMIN B 12: 417 pg/mL (ref 211–911)

## 2015-12-13 LAB — TSH: TSH: 1.97 u[IU]/mL (ref 0.35–4.50)

## 2015-12-13 MED ORDER — PREDNISONE 10 MG PO TABS: ORAL_TABLET | ORAL | Status: DC

## 2015-12-13 MED ORDER — ALPRAZOLAM 0.25 MG PO TABS: 0.2500 mg | ORAL_TABLET | Freq: Every day | ORAL | Status: DC | PRN

## 2015-12-13 MED ORDER — PANTOPRAZOLE SODIUM 40 MG PO TBEC: 40.0000 mg | DELAYED_RELEASE_TABLET | Freq: Every day | ORAL | Status: DC

## 2015-12-13 MED ORDER — CITALOPRAM HYDROBROMIDE 10 MG PO TABS: 10.0000 mg | ORAL_TABLET | Freq: Every day | ORAL | Status: DC

## 2015-12-13 MED ORDER — ESOMEPRAZOLE MAGNESIUM 40 MG PO CPDR: 40.0000 mg | DELAYED_RELEASE_CAPSULE | Freq: Every day | ORAL | Status: DC

## 2015-12-13 NOTE — Progress Notes (Signed)
Patient ID: Jenny Stephenson, female   DOB: [birth year], 50 y.o.   MRN: [id number]   Subjective:    Patient ID: Jenny Stephenson, female    DOB: [birth year], 25 y.o.   MRN: [id number]  HPI  Patient here as a work in with multiple concerns.  She has not been seen since 2015.  Increased stress with family medical issues.  Not sleeping well.  Notices a noise in her throat when she sleeps.  Also has had a feeling in her throat. Some questionable wheezing.   Increased burping.  Some acid reflux.  Taking otc nexium for the last three weeks.  Also started zyrtec.  After eats, feels a fullness.  Feels some tightness across her sinuses.  Was evaluated by ENT - Dr Tami Ribas.  States vocal cords ok.  Also went to acute care [facility].  Treated with prednisone and zpak.  Some improvement while on these medications.  No abdominal pain.  Bowels stable.     Past Medical History  Diagnosis Date  . Hypertension   . Sciatica of left side 02/28/2014  . Obesity   . Carpal tunnel syndrome     bilateral   Past Surgical History  Procedure Laterality Date  . Tonsillectomy  1973  . Cesarean section  1993 and 2001  . Cholescystectomy  1995  . Arm surgery  1999    screws/plates  . Vaginal hysterectomy  2004    ovaries not removed, secondary to fibroids and bleeding  . Carpal tunnel release  2008   Family History  Problem Relation Age of Onset  . Prostate cancer Father   . Hypertension Mother   . Ulcerative colitis Mother   . Colon polyps Mother   . Heart disease Maternal Grandmother     myocardial infarction  . Colon cancer Neg Hx   . Breast cancer Neg Hx    Social History   Social History  . Marital Status: Married    Spouse Name: N/A  . Number of Children: 2  . Years of Education: college   Occupational History  . medical transcriptionist   .     Social History Main Topics  . Smoking status: Never Smoker   . Smokeless tobacco: Never Used  . Alcohol Use: No  . Drug Use: No  . Sexual Activity:  Not Asked   Other Topics Concern  . None   Social History Narrative    Outpatient Encounter Prescriptions as of 12/13/2015  Medication Sig  . Cetirizine HCl (ZYRTEC PO) Take 10 mg by mouth daily.  . diphenhydrAMINE (BENADRYL) 25 mg capsule Take 25 mg by mouth at bedtime as needed.   Marland Kitchen ibuprofen (ADVIL,MOTRIN) 200 MG tablet Take 200 mg by mouth every 6 (six) hours as needed.  . [DISCONTINUED] ALPRAZolam (XANAX) 0.25 MG tablet Take 1 tablet (0.25 mg total) by mouth daily as needed for anxiety.  . [DISCONTINUED] esomeprazole (NEXIUM) 20 MG capsule Take 20 mg by mouth daily.  Marland Kitchen ALPRAZolam (XANAX) 0.25 MG tablet Take 1 tablet (0.25 mg total) by mouth daily as needed for anxiety.  . citalopram (CELEXA) 10 MG tablet Take 1 tablet (10 mg total) by mouth daily.  . pantoprazole (PROTONIX) 40 MG tablet Take 1 tablet (40 mg total) by mouth daily.  . predniSONE (DELTASONE) 10 MG tablet Take 4 tablets x 1 day and then decrease by 1/2 tablet per day until down to zero mg.  . [DISCONTINUED] azithromycin (ZITHROMAX) 250 MG tablet Take 500 mg once, then 250  mg for four days  . [DISCONTINUED] benzonatate (TESSALON) 200 MG capsule Take 1 capsule (200 mg total) by mouth 3 (three) times daily as needed. (Patient not taking: Reported on 12/13/2015)  . [DISCONTINUED] esomeprazole (NEXIUM) 40 MG capsule Take 1 capsule (40 mg total) by mouth daily at 12 noon.  . [DISCONTINUED] gabapentin (NEURONTIN) 300 MG capsule Take 1 capsule (300 mg total) by mouth 3 (three) times daily as needed.   No facility-administered encounter medications on file as of 12/13/2015.    Review of Systems  Constitutional: Positive for appetite change. Negative for fever.  HENT: Positive for congestion and sinus pressure.   Respiratory: Positive for cough. Negative for chest tightness and shortness of breath.   Cardiovascular: Negative for chest pain, palpitations and leg swelling.  Gastrointestinal: Negative for nausea, vomiting, abdominal  pain and diarrhea.  Genitourinary: Negative for dysuria and difficulty urinating.  Musculoskeletal: Negative for back pain and joint swelling.  Skin: Negative for color change and rash.  Neurological: Negative for dizziness, light-headedness and headaches.  Psychiatric/Behavioral: Negative for dysphoric mood and agitation.       Increased stress.         Objective:     Blood pressure rechecked by me:  130/84  Physical Exam  Constitutional: She appears well-developed and well-nourished. No distress.  HENT:  Nose: Nose normal.  Mouth/Throat: Oropharynx is clear and moist.  Neck: Neck supple. No thyromegaly present.  Cardiovascular: Normal rate and regular rhythm.   Pulmonary/Chest: Breath sounds normal. No respiratory distress. She has no wheezes.  Abdominal: Soft. Bowel sounds are normal. There is no tenderness.  Musculoskeletal: She exhibits no edema or tenderness.  Lymphadenopathy:    She has no cervical adenopathy.  Skin: No rash noted. No erythema.  Psychiatric: She has a normal mood and affect. Her behavior is normal.    BP 130/90 mmHg  Pulse 88  Temp(Src) 98.2 F (36.8 C) (Oral)  Resp 17  Ht 5\' 8"  (1.727 m)  Wt 301 lb 4 oz (136.646 kg)  BMI 45.82 kg/m2  SpO2 96% Wt Readings from Last 3 Encounters:  12/13/15 301 lb 4 oz (136.646 kg)  04/12/14 290 lb (131.543 kg)  03/27/14 296 lb 8 oz (134.492 kg)     Lab Results  Component Value Date   WBC 12.4* 12/13/2015   HGB 14.6 12/13/2015   HCT 44.1 12/13/2015   PLT 351.0 12/13/2015   GLUCOSE 83 12/13/2015   CHOL 216* 02/27/2014   TRIG 131.0 02/27/2014   HDL 46.00 02/27/2014   LDLDIRECT 147.8 04/23/2012   LDLCALC 144* 02/27/2014   ALT 70* 12/13/2015   AST 57* 12/13/2015   NA 138 12/13/2015   K 3.9 12/13/2015   CL 95* 12/13/2015   CREATININE 0.80 12/13/2015   BUN 15 12/13/2015   CO2 27 12/13/2015   TSH 1.97 12/13/2015        Assessment & Plan:   Problem List Items Addressed This Visit    Anxiety     Discussed with her today.  Will start citalopram.  Feels she needs something to help level things out.  Has xanax if needed.  Follow closely.  Get her back in soon to reassess.        Relevant Medications   citalopram (CELEXA) 10 MG tablet   ALPRAZolam (XANAX) 0.25 MG tablet   Other Relevant Orders   CBC with Differential/Platelet (Completed)   TSH (Completed)   Vitamin B12 (Completed)   Cough    Increased cough, congestion and  some question of wheezing.  Some acid reflux.  Start protonix.  Treat with prednisone as directed.  Check cxr.  Continue allergy treatment.  Follow closely.  Get her back in soon to reassess.        GERD (gastroesophageal reflux disease)    Increased acid reflux.  Treat with protonix as directed.  Follow.        Relevant Medications   pantoprazole (PROTONIX) 40 MG tablet   Hypercholesterolemia    Low cholesterol diet and exercise.  Follow lipid panel.        Relevant Orders   Hepatic function panel (Completed)   Hypertension    Blood pressure as outlined.  Continue same medication regimen.  Follow pressures.  Follow metabolic panel.        Relevant Orders   Basic metabolic panel (Completed)    Other Visit Diagnoses    Screening breast examination    -  Primary    Relevant Orders    MM DIGITAL SCREENING BILATERAL    Wheezing        Relevant Orders    DG Chest 2 View (Completed)        Einar Pheasant, MD

## 2015-12-13 NOTE — Progress Notes (Signed)
Pre-visit discussion using our clinic review tool. No additional management support is needed unless otherwise documented below in the visit note.  

## 2015-12-14 ENCOUNTER — Other Ambulatory Visit: Payer: Self-pay | Admitting: Internal Medicine

## 2015-12-14 DIAGNOSIS — D72829 Elevated white blood cell count, unspecified: Secondary | ICD-10-CM

## 2015-12-14 DIAGNOSIS — R945 Abnormal results of liver function studies: Secondary | ICD-10-CM

## 2015-12-14 DIAGNOSIS — R7989 Other specified abnormal findings of blood chemistry: Secondary | ICD-10-CM

## 2015-12-14 LAB — BASIC METABOLIC PANEL
BUN: 15 mg/dL (ref 6–23)
CALCIUM: 10.2 mg/dL (ref 8.4–10.5)
CHLORIDE: 95 meq/L — AB (ref 96–112)
CO2: 27 meq/L (ref 19–32)
CREATININE: 0.8 mg/dL (ref 0.40–1.20)
GFR: 80.89 mL/min (ref >60.00)
Glucose, Bld: 83 mg/dL (ref 70–99)
Potassium: 3.9 mEq/L (ref 3.5–5.1)
Sodium: 138 mEq/L (ref 135–145)

## 2015-12-14 LAB — HEPATIC FUNCTION PANEL
ALT: 70 U/L — AB (ref 0–35)
AST: 57 U/L — ABNORMAL HIGH (ref 0–37)
Albumin: 4.4 g/dL (ref 3.5–5.2)
Alkaline Phosphatase: 103 U/L (ref 39–117)
BILIRUBIN DIRECT: 0.2 mg/dL (ref 0.0–0.3)
BILIRUBIN TOTAL: 0.8 mg/dL (ref 0.2–1.2)
TOTAL PROTEIN: 7.5 g/dL (ref 6.0–8.3)

## 2015-12-14 NOTE — Progress Notes (Signed)
Orders placed for f/u labs.  

## 2015-12-14 NOTE — Telephone Encounter (Signed)
Called pt and discussed cxr.

## 2015-12-16 ENCOUNTER — Encounter: Payer: Self-pay | Admitting: Internal Medicine

## 2015-12-16 DIAGNOSIS — R059 Cough, unspecified: Secondary | ICD-10-CM | POA: Insufficient documentation

## 2015-12-16 DIAGNOSIS — R05 Cough: Secondary | ICD-10-CM | POA: Insufficient documentation

## 2015-12-16 DIAGNOSIS — K219 Gastro-esophageal reflux disease without esophagitis: Secondary | ICD-10-CM | POA: Insufficient documentation

## 2015-12-16 NOTE — Assessment & Plan Note (Signed)
Increased cough, congestion and some question of wheezing.  Some acid reflux.  Start protonix.  Treat with prednisone as directed.  Check cxr.  Continue allergy treatment.  Follow closely.  Get her back in soon to reassess.

## 2015-12-16 NOTE — Assessment & Plan Note (Signed)
Discussed with her today.  Will start citalopram.  Feels she needs something to help level things out.  Has xanax if needed.  Follow closely.  Get her back in soon to reassess.

## 2015-12-16 NOTE — Assessment & Plan Note (Signed)
Increased acid reflux.  Treat with protonix as directed.  Follow.

## 2015-12-16 NOTE — Assessment & Plan Note (Addendum)
Blood pressure as outlined.  Continue same medication regimen.  Follow pressures.  Follow metabolic panel.  

## 2015-12-16 NOTE — Assessment & Plan Note (Signed)
Low cholesterol diet and exercise.  Follow lipid panel.   

## 2015-12-18 ENCOUNTER — Ambulatory Visit: Payer: Self-pay | Admitting: Internal Medicine

## 2015-12-24 ENCOUNTER — Other Ambulatory Visit: Payer: 59

## 2015-12-25 ENCOUNTER — Other Ambulatory Visit (INDEPENDENT_AMBULATORY_CARE_PROVIDER_SITE_OTHER): Payer: 59

## 2015-12-25 DIAGNOSIS — R945 Abnormal results of liver function studies: Secondary | ICD-10-CM

## 2015-12-25 DIAGNOSIS — R7989 Other specified abnormal findings of blood chemistry: Secondary | ICD-10-CM | POA: Diagnosis not present

## 2015-12-25 DIAGNOSIS — D72829 Elevated white blood cell count, unspecified: Secondary | ICD-10-CM

## 2015-12-25 LAB — CBC WITH DIFFERENTIAL/PLATELET
BASOS ABS: 0 10*3/uL (ref 0.0–0.1)
BASOS PCT: 0.3 % (ref 0.0–3.0)
EOS ABS: 0.5 10*3/uL (ref 0.0–0.7)
Eosinophils Relative: 5.2 % — ABNORMAL HIGH (ref 0.0–5.0)
HEMATOCRIT: 39 % (ref 36.0–46.0)
Hemoglobin: 12.9 g/dL (ref 12.0–15.0)
LYMPHS ABS: 2.9 10*3/uL (ref 0.7–4.0)
LYMPHS PCT: 31.2 % (ref 12.0–46.0)
MCHC: 33.1 g/dL (ref 30.0–36.0)
MCV: 82.9 fl (ref 78.0–100.0)
MONOS PCT: 5.5 % (ref 3.0–12.0)
Monocytes Absolute: 0.5 10*3/uL (ref 0.1–1.0)
NEUTROS ABS: 5.4 10*3/uL (ref 1.4–7.7)
NEUTROS PCT: 57.8 % (ref 43.0–77.0)
PLATELETS: 247 10*3/uL (ref 150.0–400.0)
RBC: 4.71 Mil/uL (ref 3.87–5.11)
RDW: 14.9 % (ref 11.5–15.5)
WBC: 9.4 10*3/uL (ref 4.0–10.5)

## 2015-12-25 LAB — HEPATIC FUNCTION PANEL
ALBUMIN: 3.9 g/dL (ref 3.5–5.2)
ALK PHOS: 90 U/L (ref 39–117)
ALT: 47 U/L — ABNORMAL HIGH (ref 0–35)
AST: 30 U/L (ref 0–37)
Bilirubin, Direct: 0.1 mg/dL (ref 0.0–0.3)
TOTAL PROTEIN: 6.6 g/dL (ref 6.0–8.3)
Total Bilirubin: 0.6 mg/dL (ref 0.2–1.2)

## 2015-12-26 ENCOUNTER — Encounter: Payer: Self-pay | Admitting: Internal Medicine

## 2015-12-28 ENCOUNTER — Other Ambulatory Visit: Payer: Self-pay | Admitting: Internal Medicine

## 2015-12-28 ENCOUNTER — Ambulatory Visit
Admission: RE | Admit: 2015-12-28 | Discharge: 2015-12-28 | Disposition: A | Discharge status: Home / Self Care | Payer: 59 | Source: Ambulatory Visit | Attending: Internal Medicine | Admitting: Internal Medicine

## 2015-12-28 DIAGNOSIS — Z1231 Encounter for screening mammogram for malignant neoplasm of breast: Secondary | ICD-10-CM

## 2015-12-28 DIAGNOSIS — Z1239 Encounter for other screening for malignant neoplasm of breast: Secondary | ICD-10-CM

## 2016-01-03 ENCOUNTER — Encounter: Payer: Self-pay | Admitting: Internal Medicine

## 2016-01-03 DIAGNOSIS — Z0000 Encounter for general adult medical examination without abnormal findings: Secondary | ICD-10-CM | POA: Insufficient documentation

## 2016-01-28 ENCOUNTER — Ambulatory Visit (INDEPENDENT_AMBULATORY_CARE_PROVIDER_SITE_OTHER): Payer: 59 | Admitting: Internal Medicine

## 2016-01-28 ENCOUNTER — Encounter: Payer: Self-pay | Admitting: Internal Medicine

## 2016-01-28 ENCOUNTER — Other Ambulatory Visit (HOSPITAL_COMMUNITY)
Admission: RE | Admit: 2016-01-28 | Discharge: 2016-01-28 | Disposition: A | Discharge status: Home / Self Care | Payer: 59 | Source: Ambulatory Visit | Attending: Internal Medicine | Admitting: Internal Medicine

## 2016-01-28 VITALS — BP 120/84 | HR 66 | Temp 98.2°F | Resp 16 | Ht 66.0 in | Wt 305.0 lb

## 2016-01-28 DIAGNOSIS — Z1272 Encounter for screening for malignant neoplasm of vagina: Secondary | ICD-10-CM

## 2016-01-28 DIAGNOSIS — Z1151 Encounter for screening for human papillomavirus (HPV): Secondary | ICD-10-CM | POA: Insufficient documentation

## 2016-01-28 DIAGNOSIS — E7800 Pure hypercholesterolemia, unspecified: Secondary | ICD-10-CM | POA: Diagnosis not present

## 2016-01-28 DIAGNOSIS — F419 Anxiety disorder, unspecified: Secondary | ICD-10-CM

## 2016-01-28 DIAGNOSIS — Z01419 Encounter for gynecological examination (general) (routine) without abnormal findings: Secondary | ICD-10-CM | POA: Diagnosis present

## 2016-01-28 DIAGNOSIS — I10 Essential (primary) hypertension: Secondary | ICD-10-CM | POA: Diagnosis not present

## 2016-01-28 DIAGNOSIS — Z0000 Encounter for general adult medical examination without abnormal findings: Secondary | ICD-10-CM | POA: Diagnosis not present

## 2016-01-28 DIAGNOSIS — K219 Gastro-esophageal reflux disease without esophagitis: Secondary | ICD-10-CM

## 2016-01-28 MED ORDER — CITALOPRAM HYDROBROMIDE 20 MG PO TABS: 20.0000 mg | ORAL_TABLET | Freq: Every day | ORAL | 2 refills | Status: DC

## 2016-01-28 MED ORDER — PANTOPRAZOLE SODIUM 40 MG PO TBEC: 40.0000 mg | DELAYED_RELEASE_TABLET | Freq: Two times a day (BID) | ORAL | 2 refills | Status: DC

## 2016-01-28 NOTE — Assessment & Plan Note (Signed)
Physical today 01/28/16.  Mammogram 01/03/16 - Birads I.  PAP 01/28/16.  Is s/p hysterectomy.

## 2016-01-28 NOTE — Progress Notes (Signed)
Patient ID: Jenny Stephenson, female   DOB: 1965/07/10, 50 y.o.   MRN: [id number]   Subjective:    Patient ID: Jenny Stephenson, female    DOB: 08/08/1965, 44 y.o.   MRN: [id number]  HPI  Patient here for her physical exam.  She is doing better.  Feels better.  Breathing better.  No wheezing.  Acid reflux is better.  Still some, but overall significantly improved.  No chest pain.  No abdominal pain.  Bowels stable.  On citalopram.  Feels needs to increase the dose.     Past Medical History:  Diagnosis Date  . Carpal tunnel syndrome    bilateral  . Hypertension   . Obesity   . Sciatica of left side 02/28/2014   Past Surgical History:  Procedure Laterality Date  . arm surgery  1999   screws/plates  . CARPAL TUNNEL RELEASE  2008  . [place] and 2001  . cholescystectomy  1995  . TONSILLECTOMY  1973  . VAGINAL HYSTERECTOMY  2004   ovaries not removed, secondary to fibroids and bleeding   Family History  Problem Relation Age of Onset  . Prostate cancer Father   . Lymphoma Father   . Hypertension Mother   . Ulcerative colitis Mother   . Colon polyps Mother   . Kidney cancer Mother   . Heart disease Maternal Grandmother     myocardial infarction  . Colon cancer Neg Hx   . Breast cancer Neg Hx    Social History   Social History  . Marital status: Married    Spouse name: [name]  . Number of children: 2  . Years of education: college   Occupational History  . medical transcriptionist   .  Self Employed   Social History Main Topics  . Smoking status: Never Smoker  . Smokeless tobacco: Never Used  . Alcohol use No  . Drug use: No  . Sexual activity: Yes    Birth control/ protection: Surgical   Other Topics Concern  . None   Social History Narrative  . None    Outpatient Encounter Prescriptions as of 01/28/2016  Medication Sig  . ALPRAZolam (XANAX) 0.25 MG tablet Take 1 tablet (0.25 mg total) by mouth daily as needed for anxiety.  .  Cetirizine HCl (ZYRTEC PO) Take 10 mg by mouth daily.  . diphenhydrAMINE (BENADRYL) 25 mg capsule Take 25 mg by mouth at bedtime as needed.   Marland Kitchen ibuprofen (ADVIL,MOTRIN) 200 MG tablet Take 200 mg by mouth every 6 (six) hours as needed.  . [DISCONTINUED] citalopram (CELEXA) 10 MG tablet Take 1 tablet (10 mg total) by mouth daily.  . [DISCONTINUED] pantoprazole (PROTONIX) 40 MG tablet Take 1 tablet (40 mg total) by mouth daily.  . [DISCONTINUED] pantoprazole (PROTONIX) 40 MG tablet Take 1 tablet (40 mg total) by mouth 2 (two) times daily before a meal.  . [DISCONTINUED] citalopram (CELEXA) 20 MG tablet Take 1 tablet (20 mg total) by mouth daily.  . [DISCONTINUED] predniSONE (DELTASONE) 10 MG tablet Take 4 tablets x 1 day and then decrease by 1/2 tablet per day until down to zero mg.   No facility-administered encounter medications on file as of 01/28/2016.     Review of Systems  Constitutional: Negative for activity change and unexpected weight change.  HENT: Negative for congestion and sinus pressure.   Eyes: Negative for pain and visual disturbance.  Respiratory: Negative for cough, chest tightness, shortness of breath and wheezing.  Cough has resolved.  No wheezing now.  Breathing better.   Cardiovascular: Negative for chest pain, palpitations and leg swelling.  Gastrointestinal: Negative for abdominal pain, diarrhea, nausea and vomiting.       Reflux is better.  Still with some symptoms, but much improved.    Genitourinary: Negative for difficulty urinating and dysuria.  Musculoskeletal: Negative for back pain and joint swelling.  Skin: Negative for color change and rash.  Neurological: Negative for dizziness, light-headedness and headaches.  Hematological: Negative for adenopathy. Does not bruise/bleed easily.  Psychiatric/Behavioral: Negative for agitation and dysphoric mood.       Objective:    Physical Exam  Constitutional: She is oriented to person, place, and time. She  appears well-developed and well-nourished. No distress.  HENT:  Nose: Nose normal.  Mouth/Throat: Oropharynx is clear and moist.  Eyes: Right eye exhibits no discharge. Left eye exhibits no discharge. No scleral icterus.  Neck: Neck supple. No thyromegaly present.  Cardiovascular: Normal rate and regular rhythm.   Pulmonary/Chest: Breath sounds normal. No accessory muscle usage. No tachypnea. No respiratory distress. She has no decreased breath sounds. She has no wheezes. She has no rhonchi. Right breast exhibits no inverted nipple, no mass, no nipple discharge and no tenderness (no axillary adenopathy). Left breast exhibits no inverted nipple, no mass, no nipple discharge and no tenderness (no axilarry adenopathy).  Abdominal: Soft. Bowel sounds are normal. There is no tenderness.  Genitourinary:  Genitourinary Comments: Normal external genitalia.  Vaginal vault without lesions.  Cervix identified.  Pap smear performed.  Could not appreciate any adnexal masses or tenderness.    Musculoskeletal: She exhibits no edema or tenderness.  Lymphadenopathy:    She has no cervical adenopathy.  Neurological: She is alert and oriented to person, place, and time.  Skin: Skin is warm. No rash noted. No erythema.  Psychiatric: She has a normal mood and affect. Her behavior is normal.    BP 120/84 (BP Location: Left Arm, Patient Position: Sitting, Cuff Size: Large)   Pulse 66   Temp 98.2 F (36.8 C) (Oral)   Resp 16   Ht 5\' 6"  (1.676 m)   Wt (!) 305 lb (138.3 kg)   BMI 49.23 kg/m  Wt Readings from Last 3 Encounters:  01/28/16 (!) 305 lb (138.3 kg)  12/13/15 (!) 301 lb 4 oz (136.6 kg)  04/12/14 290 lb (131.5 kg)     Lab Results  Component Value Date   WBC 9.4 12/25/2015   HGB 12.9 12/25/2015   HCT 39.0 12/25/2015   PLT 247.0 12/25/2015   GLUCOSE 83 12/13/2015   CHOL 216 (H) 02/27/2014   TRIG 131.0 02/27/2014   HDL 46.00 02/27/2014   LDLDIRECT 147.8 04/23/2012   LDLCALC 144 (H)  02/27/2014   ALT 47 (H) 12/25/2015   AST 30 12/25/2015   NA 138 12/13/2015   K 3.9 12/13/2015   CL 95 (L) 12/13/2015   CREATININE 0.80 12/13/2015   BUN 15 12/13/2015   CO2 27 12/13/2015   TSH 1.97 12/13/2015    Mm Screening Breast Tomo Bilateral  Result Date: 01/03/2016 CLINICAL DATA:  Screening. EXAM: 2D DIGITAL SCREENING BILATERAL MAMMOGRAM WITH CAD AND ADJUNCT TOMO COMPARISON:  Previous exams 09/13/2014, 02/03/2013, 06/17/2011 from [facility], [place]. Awaiting the prior outside mammograms accounts for the delay in this report. ACR Breast Density Category b: There are scattered areas of fibroglandular density. FINDINGS: There are no findings suspicious for malignancy. Images were processed with CAD. IMPRESSION:  No mammographic evidence of malignancy. A result letter of this screening mammogram will be mailed directly to the patient. RECOMMENDATION: Screening mammogram in one year. (Code:SM-B-01Y) BI-RADS CATEGORY  1: Negative. Electronically Signed   By: Evangeline Dakin M.D.   On: 01/03/2016 12:50       Assessment & Plan:   Problem List Items Addressed This Visit    Anxiety    On citalopram.  Feels needs to increase the dose.  Increase to 20mg  q day.  Follow.        GERD (gastroesophageal reflux disease)    Acid reflux better.  protonix bid.  Follow.       Healthcare maintenance    Physical today 01/28/16.  Mammogram 01/03/16 - [facility].  PAP 01/28/16.  Is s/p hysterectomy.        Hypercholesterolemia    Low cholesterol diet and exercise.  Follow lipid panel.   Lab Results  Component Value Date   CHOL 216 (H) 02/27/2014   HDL 46.00 02/27/2014   LDLCALC 144 (H) 02/27/2014   LDLDIRECT 147.8 04/23/2012   TRIG 131.0 02/27/2014   CHOLHDL 5 02/27/2014        Relevant Orders   Lipid panel   Hepatic function panel   Hypertension    Blood pressure under good control.  Continue same medication regimen.  Follow pressures.  Follow metabolic panel.          Relevant Orders   Basic metabolic panel    Other Visit Diagnoses    Routine general medical examination at a health care facility    -  Primary   Screening for vaginal cancer       Relevant Orders   Cytology - PAP (Completed)       Einar Pheasant, MD

## 2016-01-29 ENCOUNTER — Other Ambulatory Visit: Payer: Self-pay

## 2016-01-29 LAB — CYTOLOGY - PAP

## 2016-01-29 NOTE — Telephone Encounter (Signed)
Pharmacy faxed 90 day rx request. Renaldo Fiddler, CMA

## 2016-01-30 ENCOUNTER — Encounter: Payer: Self-pay | Admitting: Internal Medicine

## 2016-01-30 MED ORDER — PANTOPRAZOLE SODIUM 40 MG PO TBEC: 40.0000 mg | DELAYED_RELEASE_TABLET | Freq: Two times a day (BID) | ORAL | 0 refills | Status: DC

## 2016-01-30 MED ORDER — CITALOPRAM HYDROBROMIDE 20 MG PO TABS: 20.0000 mg | ORAL_TABLET | Freq: Every day | ORAL | 0 refills | Status: DC

## 2016-01-30 NOTE — Telephone Encounter (Signed)
I have ok'd rx.  Please call pharmacy and cancel the rx for 30 day supply.  I have ok'd rx for 90 day supply.  Thanks

## 2016-02-03 ENCOUNTER — Encounter: Payer: Self-pay | Admitting: Internal Medicine

## 2016-02-03 NOTE — Assessment & Plan Note (Signed)
Acid reflux better.  protonix bid.  Follow.

## 2016-02-03 NOTE — Assessment & Plan Note (Signed)
Blood pressure under good control.  Continue same medication regimen.  Follow pressures.  Follow metabolic panel.   

## 2016-02-03 NOTE — Assessment & Plan Note (Signed)
Low cholesterol diet and exercise.  Follow lipid panel.   Lab Results  Component Value Date   CHOL 216 (H) 02/27/2014   HDL 46.00 02/27/2014   LDLCALC 144 (H) 02/27/2014   LDLDIRECT 147.8 04/23/2012   TRIG 131.0 02/27/2014   CHOLHDL 5 02/27/2014

## 2016-02-03 NOTE — Assessment & Plan Note (Signed)
On citalopram.  Feels needs to increase the dose.  Increase to 20mg  q day.  Follow.

## 2016-02-27 ENCOUNTER — Other Ambulatory Visit: Payer: 59

## 2016-04-03 ENCOUNTER — Ambulatory Visit: Payer: 59 | Admitting: Internal Medicine

## 2016-06-06 ENCOUNTER — Telehealth: Payer: 59 | Admitting: Family

## 2016-06-06 DIAGNOSIS — J019 Acute sinusitis, unspecified: Secondary | ICD-10-CM

## 2016-06-06 MED ORDER — AMOXICILLIN-POT CLAVULANATE 875-125 MG PO TABS: 1.0000 | ORAL_TABLET | Freq: Two times a day (BID) | ORAL | 0 refills | Status: DC

## 2016-06-06 NOTE — Progress Notes (Signed)

## 2016-07-06 ENCOUNTER — Other Ambulatory Visit: Payer: Self-pay | Admitting: Internal Medicine

## 2016-07-20 ENCOUNTER — Ambulatory Visit (INDEPENDENT_AMBULATORY_CARE_PROVIDER_SITE_OTHER): Payer: 59

## 2016-07-20 ENCOUNTER — Ambulatory Visit
Admission: EM | Admit: 2016-07-20 | Discharge: 2016-07-20 | Disposition: A | Discharge status: Home / Self Care | Payer: 59 | Attending: Family Medicine | Admitting: Family Medicine

## 2016-07-20 DIAGNOSIS — S60112A Contusion of left thumb with damage to nail, initial encounter: Secondary | ICD-10-CM

## 2016-07-20 DIAGNOSIS — S6992XA Unspecified injury of left wrist, hand and finger(s), initial encounter: Secondary | ICD-10-CM

## 2016-07-20 MED ORDER — TETANUS-DIPHTH-ACELL PERTUSSIS 5-2.5-18.5 LF-MCG/0.5 IM SUSP
0.5000 mL | Freq: Once | INTRAMUSCULAR | Status: AC
Start: 2016-07-20 — End: 2016-07-20
  Administered 2016-07-20: 0.5 mL via INTRAMUSCULAR

## 2016-07-20 NOTE — ED Triage Notes (Signed)
[place] in [place]  Hanging bleeding was this morning as per patient.

## 2016-07-20 NOTE — ED Provider Notes (Signed)
[facility] ____________________________________________  Time seen: Approximately [id number] PM  I have reviewed the triage vital signs and the nursing notes.   HISTORY  Chief Complaint Nail Problem (Left arm thumb nail )  HPI Jenny Stephenson is a 51 y.o. female presents with daughter at bedside for the complaints of left thumb injury that occurred approximately 11 AM this morning. Patient reports that she was walking through that aisle at church and accidentally hit her thumb on a wooden pew causing the nail to be pushed upwards and causing thumb to bleed.  reports mild/moderate pain distal thumb at this time. Reports right-hand dominant. Last tetanus immunization greater than 10 years ago. Denies other pain or injury. Denies fall to the ground. Denies head injury or loss consciousness. Denies paresthesias.   Denies chest pain, shortness of breath, abdominal pain, dysuria,  other extremity pain,  other extremity swelling or rash. Denies recent sickness. Denies recent antibiotic use.   No LMP recorded. Patient has had a hysterectomy.  Einar Pheasant, MD: PCP   Past Medical History:  Diagnosis Date  . Carpal tunnel syndrome    bilateral  . Hypertension   . Obesity   . Sciatica of left side 02/28/2014    Patient Active Problem List   Diagnosis Date Noted  . Healthcare maintenance 01/03/2016  . Cough 12/16/2015  . GERD (gastroesophageal reflux disease) 12/16/2015  . Anxiety 02/28/2014  . Sciatica of left side 02/28/2014  . Numbness in feet 02/27/2014  . Hypercholesterolemia 06/07/2012  . Hypertension 04/24/2012    Past Surgical History:  Procedure Laterality Date  . arm surgery  1999   screws/plates  . CARPAL TUNNEL RELEASE  2008  . [place] and 2001  . cholescystectomy  1995  . TONSILLECTOMY  1973  . VAGINAL HYSTERECTOMY  2004   ovaries not removed, secondary to fibroids and bleeding     No current facility-administered medications for  this encounter.   Current Outpatient Prescriptions:  .  Cetirizine HCl (ZYRTEC PO), Take 10 mg by mouth as needed. , Disp: , Rfl:  .  citalopram (CELEXA) 20 MG tablet, TAKE 1 TABLET (20 MG TOTAL) BY MOUTH DAILY., Disp: 90 tablet, Rfl: 0 .  diphenhydrAMINE (BENADRYL) 25 mg capsule, Take 25 mg by mouth at bedtime as needed. , Disp: , Rfl:  .  ibuprofen (ADVIL,MOTRIN) 200 MG tablet, Take 200 mg by mouth every 6 (six) hours as needed., Disp: , Rfl:  .  pantoprazole (PROTONIX) 40 MG tablet, Take 1 tablet (40 mg total) by mouth 2 (two) times daily before a meal., Disp: 180 tablet, Rfl: 0 .  ALPRAZolam (XANAX) 0.25 MG tablet, Take 1 tablet (0.25 mg total) by mouth daily as needed for anxiety., Disp: 30 tablet, Rfl: 0 .  amoxicillin-clavulanate (AUGMENTIN) 875-125 MG tablet, Take 1 tablet by mouth 2 (two) times daily., Disp: 14 tablet, Rfl: 0  Allergies Codeine  Family History  Problem Relation Age of Onset  . Prostate cancer Father   . Lymphoma Father   . Hypertension Mother   . Ulcerative colitis Mother   . Colon polyps Mother   . Kidney cancer Mother   . Heart disease Maternal Grandmother     myocardial infarction  . Colon cancer Neg Hx   . Breast cancer Neg Hx     Social History Social History  Substance Use Topics  . Smoking status: Never Smoker  . Smokeless tobacco: Never Used  . Alcohol use No    Review of  Systems Constitutional: No fever/chills Eyes: No visual changes. ENT: No sore throat. Cardiovascular: Denies chest pain. Respiratory: Denies shortness of breath. Gastrointestinal: No abdominal pain.  No nausea, no vomiting.  No diarrhea.  No constipation. Genitourinary: Negative for dysuria. Musculoskeletal: Negative for back pain. Skin: Negative for rash.As well.  Neurological: Negative for headaches, focal weakness or numbness.  10-point ROS otherwise negative.  ____________________________________________   PHYSICAL EXAM:  VITAL SIGNS: ED Triage Vitals    Enc Vitals Group     BP 07/20/16 1413 139/78     Pulse -- 82     Resp 07/20/16 1413 16     Temp 07/20/16 1413 98.1 F (36.7 C)     Temp Source 07/20/16 1413 Oral     SpO2 07/20/16 1413 98 %     Weight 07/20/16 1410 294 lb (133.4 kg)     Height 07/20/16 1410 5\' 8"  (1.727 m)     Head Circumference --      Peak Flow --      Pain Score 07/20/16 1418 6     Pain Loc --      Pain Edu? --      Excl. in [place]? --     Constitutional: Alert and oriented. Well appearing and in no acute distress. Eyes: Conjunctivae are normal. PERRL. EOMI. ENT      Head: Normocephalic and atraumatic. Cardiovascular: Normal rate, regular rhythm. Grossly normal heart sounds.  Good peripheral circulation. Respiratory: Normal respiratory effort without tachypnea nor retractions. Breath sounds are clear and equal bilaterally. No wheezes, rales, rhonchi. Musculoskeletal: No midline cervical, thoracic or lumbar tenderness to palpation. Neurologic:  Normal speech and language. Speech is normal. No gait instability.  Skin:  Skin is warm, dry and intact. No rash noted. Except : Left thumb proximal lateral aspect base of nail pulled outside of nail fold, nail intact, mild subungual hematoma noted, thumbnail well attached to the medial aspect of thumb as well as medial aspect of proximal cuticle base, nail bed inspected with superficial abrasion, no sutures indicated. Left thumb normal distal sensation, no motor or tendon deficit noted. Left hand otherwise nontender.  Psychiatric: Mood and affect are normal. Speech and behavior are normal. Patient exhibits appropriate insight and judgment   ___________________________________________   LABS (all labs ordered are listed, but only abnormal results are displayed)  Labs Reviewed - No data to display  RADIOLOGY  Dg Finger Thumb Left  Result Date: 07/20/2016 CLINICAL DATA:  Left hand injury today with bleeding. Distal thumb pain. EXAM: LEFT THUMB 2+V COMPARISON:  None.  FINDINGS: The mineralization and alignment are normal. There is no evidence of acute fracture or dislocation. Mild interphalangeal degenerative changes. No focal soft tissue abnormalities are seen. IMPRESSION: No evidence of acute fracture or dislocation. Electronically Signed   By: Richardean Sale M.D.   On: 07/20/2016 15:11   ____________________________________________   PROCEDURES Procedures   Procedure(s) performed:  Procedure explained and verbal consent obtained. Consent: Verbal consent obtained. Written consent not obtained. Risks and benefits: risks, benefits and alternatives were discussed Patient identity confirmed: verbally with patient and hospital-assigned identification number  Consent given by: patient   Left thumbnail injury Foreign bodies: no foreign bodies visualized Tendon involvement: none Nerve involvement: none Anesthesia none Irrigation solution: saline and betadine soak and irrigation Irrigation method: jet lavage Amount of cleaning: copious Base of nail bed inspected, superficial abrasions, no laceration. Proximal lateral nail bed corner re inserted into into proximal nail fold. Nail reapproximated. Area copiously cleaned with  saline and Betadine. Steri-Strips applied over nail lateral sides of nail supported with sterile adhesive application.  Sterile cautery pen used and trepidation made at proximal lateral nail bed for drainage of subungual hematoma. Patient tolerate well. Wound well approximated post repair.  Antibiotic ointment and dressing applied. Finger splint applied.  Wound care instructions provided.  Observe for any signs of infection or other problems.     INITIAL IMPRESSION / ASSESSMENT AND PLAN / ED COURSE  Pertinent labs & imaging results that were available during my care of the patient were reviewed by me and considered in my medical decision making (see chart for details).  Well-appearing patient. No acute distress. Presents for  complaints of left thumb pain post mechanical injury prior to arrival. Tetanus immunization updated. Wound copiously cleaned and irrigated. Thumbnail reinserted and reapproximated, trepidation proximal nail for subungual hematoma drainage, Steri-Strips and surgical sterile adhesive utilized. Proximal corner nail edge no adhesive place to allow for drainage as needed. Patient tolerated well. Discussed wound care and wound monitoring. Discussed strict follow-up and return parameters.   Discussed follow up with Primary care physician this week. Discussed follow up and return parameters including no resolution or any worsening concerns. Patient verbalized understanding and agreed to plan.   ____________________________________________   FINAL CLINICAL IMPRESSION(S) / ED DIAGNOSES  Final diagnoses:  Injury to thumb (nail), left, initial encounter  Subungual hematoma of left thumb, initial encounter  Contusion of left thumb with damage to nail, initial encounter     Discharge Medication List as of 07/20/2016  4:30 PM      Note: This dictation was prepared with Dragon dictation along with smaller phrase technology. Any transcriptional errors that result from this process are unintentional.         Marylene Land, NP 07/20/16 1719

## 2016-07-20 NOTE — Discharge Instructions (Signed)
Keep clean as discussed. Avoid reinjury.  Follow up with your primary care physician this week as needed. Return to Urgent care for new or worsening concerns.

## 2016-08-02 ENCOUNTER — Other Ambulatory Visit: Payer: Self-pay | Admitting: Internal Medicine

## 2016-08-09 ENCOUNTER — Telehealth: Payer: 59 | Admitting: Nurse Practitioner

## 2016-08-09 DIAGNOSIS — R05 Cough: Secondary | ICD-10-CM

## 2016-08-09 DIAGNOSIS — R059 Cough, unspecified: Secondary | ICD-10-CM

## 2016-08-09 MED ORDER — ALBUTEROL SULFATE HFA 108 (90 BASE) MCG/ACT IN AERS: 2.0000 | INHALATION_SPRAY | Freq: Four times a day (QID) | RESPIRATORY_TRACT | 0 refills | Status: DC | PRN

## 2016-08-09 MED ORDER — AMOXICILLIN-POT CLAVULANATE 875-125 MG PO TABS: 1.0000 | ORAL_TABLET | Freq: Two times a day (BID) | ORAL | 0 refills | Status: DC

## 2016-08-09 MED ORDER — PREDNISONE 10 MG (21) PO TBPK
ORAL_TABLET | ORAL | 0 refills | Status: DC
Start: 2016-08-09 — End: 2017-01-28

## 2016-08-09 NOTE — Progress Notes (Signed)
We are sorry that you are not feeling well.  Here is how we plan to help!  Based on what you have shared with me it looks like you have upper respiratory tract inflammation that has resulted in a significant cough.  Inflammation and infection in the upper respiratory tract is commonly called bronchitis and has four common causes:  Allergies, Viral Infections, Acid Reflux and Bacterial Infections.  Allergies, viruses and acid reflux are treated by controlling symptoms or eliminating the cause. An example might be a cough caused by taking certain blood pressure medications. You stop the cough by changing the medication. Another example might be a cough caused by acid reflux. Controlling the reflux helps control the cough.  Based on your presentation I believe you most likely have A cough due to bacteria.  When patients have a fever and a productive cough with a change in color or increased sputum production, we are concerned about bacterial bronchitis.  If left untreated it can progress to pneumonia.  If your symptoms do not improve with your treatment plan it is important that you contact your provider.   Augmentin 1 po BID x10 days    In addition you may use A non-prescription cough medication called Mucinex DM: take 2 tablets every 12 hours.  Sterapred 10 mg dosepak  USE OF BRONCHODILATOR ("RESCUE") INHALERS: Albuterol HFA 2 puffs q6prn wheezing. There is a risk from using your bronchodilator too frequently.  The risk is that over-reliance on a medication which only relaxes the muscles surrounding the breathing tubes can reduce the effectiveness of medications prescribed to reduce swelling and congestion of the tubes themselves.  Although you feel brief relief from the bronchodilator inhaler, your asthma may actually be worsening with the tubes becoming more swollen and filled with mucus.  This can delay other crucial treatments, such as oral steroid medications. If you need to use a bronchodilator  inhaler daily, several times per day, you should discuss this with your provider.  There are probably better treatments that could be used to keep your asthma under control.     HOME CARE . Only take medications as instructed by your medical team. . Complete the entire course of an antibiotic. . Drink plenty of fluids and get plenty of rest. . Avoid close contacts especially the very young and the elderly . Cover your mouth if you cough or cough into your sleeve. . Always remember to wash your hands . A steam or ultrasonic humidifier can help congestion.   GET HELP RIGHT AWAY IF: . You develop worsening fever. . You become short of breath . You cough up blood. . Your symptoms persist after you have completed your treatment plan MAKE SURE YOU   Understand these instructions.  Will watch your condition.  Will get help right away if you are not doing well or get worse.  Your e-visit answers were reviewed by a board certified advanced clinical practitioner to complete your personal care plan.  Depending on the condition, your plan could have included both over the counter or prescription medications. If there is a problem please reply  once you have received a response from your provider. Your safety is important to [place].  If you have drug allergies check your prescription carefully.    You can use MyChart to ask questions about today's visit, request a non-urgent call back, or ask for a work or school excuse for 24 hours related to this e-Visit. If it has been greater than 24  hours you will need to follow up with your provider, or enter a new e-Visit to address those concerns. You will get an e-mail in the next two days asking about your experience.  I hope that your e-visit has been valuable and will speed your recovery. Thank you for using e-visits.

## 2016-10-06 ENCOUNTER — Other Ambulatory Visit: Payer: Self-pay | Admitting: Internal Medicine

## 2016-10-06 NOTE — Telephone Encounter (Signed)
Last OV was 01/28/16, last refill was 07/07/16 #90 with no refills.  No upcoming appt. Please advise, thanks

## 2016-10-06 NOTE — Telephone Encounter (Signed)
Scheduled appt, thanks, refilled till then, thanks

## 2016-10-06 NOTE — Telephone Encounter (Signed)
Please call and make pt a f/u appt.  Unable to continue to refill until I see her.  Once appt made, then refill until appt.

## 2016-11-10 ENCOUNTER — Other Ambulatory Visit: Payer: Self-pay | Admitting: Internal Medicine

## 2017-01-08 ENCOUNTER — Ambulatory Visit: Payer: 59 | Admitting: Internal Medicine

## 2017-01-28 ENCOUNTER — Ambulatory Visit (INDEPENDENT_AMBULATORY_CARE_PROVIDER_SITE_OTHER): Payer: 59 | Admitting: Podiatry

## 2017-01-28 ENCOUNTER — Ambulatory Visit (INDEPENDENT_AMBULATORY_CARE_PROVIDER_SITE_OTHER): Payer: 59

## 2017-01-28 DIAGNOSIS — M722 Plantar fascial fibromatosis: Secondary | ICD-10-CM

## 2017-01-28 MED ORDER — MELOXICAM 15 MG PO TABS: 15.0000 mg | ORAL_TABLET | Freq: Every day | ORAL | 3 refills | Status: DC

## 2017-01-28 MED ORDER — METHYLPREDNISOLONE 4 MG PO TBPK: ORAL_TABLET | ORAL | 0 refills | Status: DC

## 2017-01-28 NOTE — Patient Instructions (Signed)

## 2017-01-28 NOTE — Progress Notes (Signed)
She presents today with a chief complaint of pain to the right heel 1 month. She states that she's had a history of plantar fasciitis before and think she is probably having a flareup of it now. She states she's been taking ibuprofen and using ice which gives her temporary relief.  Objective: Vital signs are stable she is alert and oriented 3 and in no apparent distress. Pulses are strongly palpable neurologic sensorium is intact deep tendon reflexes are intact muscle strength is intact and symmetrical bilateral orthopedic evaluation was resolved or symptoms of ankle range of motion without crepitation. Cutaneous evaluation demonstrates supple well-hydrated cutis. No other lesions or wounds. She does have pain on palpation medial calcaneal tubercle of the right heel. Radiographs taken today do demonstrate soft tissue increase in density of plantar fascia insertion site. She also has pain on palpation medially located tubercle of the left heel to a lesser degree.  Assessment: Plantar fasciitis right. Plantar fasciitis left foot.  Plan: Discussed etiology pathology inserted versus surgical therapies. At this point we injected the bilateral heels today and placed her in bilateral plantar fascial braces. Started her on a Medrol Dosepak to be followed by meloxicam and she already has a night splint at home. I will follow-up with her in 1 month.

## 2017-01-29 ENCOUNTER — Telehealth: Payer: Self-pay | Admitting: Podiatry

## 2017-01-29 ENCOUNTER — Ambulatory Visit (INDEPENDENT_AMBULATORY_CARE_PROVIDER_SITE_OTHER): Payer: 59 | Admitting: Internal Medicine

## 2017-01-29 ENCOUNTER — Encounter: Payer: Self-pay | Admitting: Internal Medicine

## 2017-01-29 VITALS — BP 132/80 | HR 66 | Temp 97.6°F | Resp 12 | Ht 68.11 in | Wt 289.8 lb

## 2017-01-29 DIAGNOSIS — F419 Anxiety disorder, unspecified: Secondary | ICD-10-CM | POA: Diagnosis not present

## 2017-01-29 DIAGNOSIS — Z0001 Encounter for general adult medical examination with abnormal findings: Secondary | ICD-10-CM | POA: Diagnosis not present

## 2017-01-29 DIAGNOSIS — Z1211 Encounter for screening for malignant neoplasm of colon: Secondary | ICD-10-CM | POA: Diagnosis not present

## 2017-01-29 DIAGNOSIS — Z1231 Encounter for screening mammogram for malignant neoplasm of breast: Secondary | ICD-10-CM

## 2017-01-29 DIAGNOSIS — R232 Flushing: Secondary | ICD-10-CM | POA: Diagnosis not present

## 2017-01-29 DIAGNOSIS — Z1239 Encounter for other screening for malignant neoplasm of breast: Secondary | ICD-10-CM

## 2017-01-29 DIAGNOSIS — K219 Gastro-esophageal reflux disease without esophagitis: Secondary | ICD-10-CM | POA: Diagnosis not present

## 2017-01-29 DIAGNOSIS — E7800 Pure hypercholesterolemia, unspecified: Secondary | ICD-10-CM

## 2017-01-29 DIAGNOSIS — Z0000 Encounter for general adult medical examination without abnormal findings: Secondary | ICD-10-CM

## 2017-01-29 DIAGNOSIS — I10 Essential (primary) hypertension: Secondary | ICD-10-CM

## 2017-01-29 DIAGNOSIS — R21 Rash and other nonspecific skin eruption: Secondary | ICD-10-CM | POA: Diagnosis not present

## 2017-01-29 LAB — COMPREHENSIVE METABOLIC PANEL
ALBUMIN: 4.4 g/dL (ref 3.5–5.2)
ALT: 53 U/L — ABNORMAL HIGH (ref 0–35)
AST: 25 U/L (ref 0–37)
Alkaline Phosphatase: 107 U/L (ref 39–117)
BUN: 19 mg/dL (ref 6–23)
CALCIUM: 9.7 mg/dL (ref 8.4–10.5)
CO2: 28 mEq/L (ref 19–32)
CREATININE: 0.69 mg/dL (ref 0.40–1.20)
Chloride: 103 mEq/L (ref 96–112)
GFR: 95.51 mL/min (ref >60.00)
Glucose, Bld: 102 mg/dL — ABNORMAL HIGH (ref 70–99)
POTASSIUM: 3.9 meq/L (ref 3.5–5.1)
Sodium: 140 mEq/L (ref 135–145)
Total Bilirubin: 0.6 mg/dL (ref 0.2–1.2)
Total Protein: 7.3 g/dL (ref 6.0–8.3)

## 2017-01-29 LAB — CBC WITH DIFFERENTIAL/PLATELET
BASOS PCT: 0.7 % (ref 0.0–3.0)
Basophils Absolute: 0.1 10*3/uL (ref 0.0–0.1)
EOS PCT: 3.8 % (ref 0.0–5.0)
Eosinophils Absolute: 0.3 10*3/uL (ref 0.0–0.7)
HEMATOCRIT: 38.9 % (ref 36.0–46.0)
HEMOGLOBIN: 12.6 g/dL (ref 12.0–15.0)
LYMPHS PCT: 29.7 % (ref 12.0–46.0)
Lymphs Abs: 2.6 10*3/uL (ref 0.7–4.0)
MCHC: 32.3 g/dL (ref 30.0–36.0)
MCV: 82.6 fl (ref 78.0–100.0)
Monocytes Absolute: 0.5 10*3/uL (ref 0.1–1.0)
Monocytes Relative: 5.8 % (ref 3.0–12.0)
Neutro Abs: 5.2 10*3/uL (ref 1.4–7.7)
Neutrophils Relative %: 60 % (ref 43.0–77.0)
Platelets: 293 10*3/uL (ref 150.0–400.0)
RBC: 4.71 Mil/uL (ref 3.87–5.11)
RDW: 14.6 % (ref 11.5–15.5)
WBC: 8.7 10*3/uL (ref 4.0–10.5)

## 2017-01-29 LAB — LIPID PANEL
CHOLESTEROL: 227 mg/dL — AB (ref 0–200)
HDL: 55.6 mg/dL (ref >39.00)
LDL CALC: 157 mg/dL — AB (ref 0–99)
NonHDL: 171.29
TRIGLYCERIDES: 70 mg/dL (ref 0.0–149.0)
Total CHOL/HDL Ratio: 4
VLDL: 14 mg/dL (ref 0.0–40.0)

## 2017-01-29 LAB — TSH: TSH: 2.31 u[IU]/mL (ref 0.35–4.50)

## 2017-01-29 MED ORDER — VENLAFAXINE HCL ER 37.5 MG PO CP24
37.5000 mg | ORAL_CAPSULE | Freq: Every day | ORAL | 2 refills | Status: DC
Start: 2017-01-29 — End: 2017-03-19

## 2017-01-29 MED ORDER — PANTOPRAZOLE SODIUM 40 MG PO TBEC: 40.0000 mg | DELAYED_RELEASE_TABLET | Freq: Two times a day (BID) | ORAL | 0 refills | Status: DC

## 2017-01-29 MED ORDER — TRIAMCINOLONE ACETONIDE 0.1 % EX CREA: 1.0000 "application " | TOPICAL_CREAM | Freq: Two times a day (BID) | CUTANEOUS | 0 refills | Status: DC

## 2017-01-29 NOTE — Telephone Encounter (Signed)
If no xray was taken then there would be no charges for the xray.

## 2017-01-29 NOTE — Telephone Encounter (Signed)
Patient was seen yesterday in [place] office by Dr. Milinda Pointer. Patient looking on my chart today, said that we put in the notes that she had xrays bilateral and she only had 1 xray of her right foot, not the left. She wanted to make sure that any charges for an xray of the left foot were removed before it was billed. She said that Dr. Milinda Pointer gave her an injection in the left foot, but didn't do any xrays of that foot.

## 2017-01-29 NOTE — Telephone Encounter (Signed)
Jenny Stephenson said she was taking it out now.

## 2017-01-29 NOTE — Assessment & Plan Note (Addendum)
Physical today 01/29/17.  Mammogram 01/03/16 - Birads I.  Schedule f/u mammogram.  PAP 01/28/16 negative with negative HPV.  Discussed the need for colon evaluation.  Refer to GI for colonoscopy.

## 2017-01-29 NOTE — Addendum Note (Signed)
Addended by: Rip Harbour on: 01/29/2017 04:28 PM   Modules accepted: Orders

## 2017-01-29 NOTE — Progress Notes (Signed)
Patient ID: Jenny Stephenson, female   DOB: 09-28-1965, 51 y.o.   MRN: [id number]   Subjective:    Patient ID: Jenny Stephenson, female    DOB: [birth year], 51 y.o.   MRN: [id number]  HPI  Patient here for her physical exam.  She reports she is doing relatively well.  Is having hot flashes. These are getting worse.  Discussed treatment options.  She desires not to take estrogen.  Discussed other treatment options.  She prefers to try effexor.  No chest pain.  No sob.  She is taking protonix.  Still with some occasional acid reflux if she eats the wrong food.  Overall relatively well controlled.  Has never had EGD.  No abdominal pain.  Bowels moving.  Does have plantar fasciitis.  Is better.   Does have rash - intermittent.  Few small areas - anterior chest and back.     Past Medical History:  Diagnosis Date  . Carpal tunnel syndrome    bilateral  . Hypertension   . Obesity   . Sciatica of left side 02/28/2014   Past Surgical History:  Procedure Laterality Date  . arm surgery  1999   screws/plates  . CARPAL TUNNEL RELEASE  2008  . [place] and 2001  . cholescystectomy  1995  . TONSILLECTOMY  1973  . VAGINAL HYSTERECTOMY  2004   ovaries not removed, secondary to fibroids and bleeding   Family History  Problem Relation Age of Onset  . Prostate cancer Father   . Lymphoma Father   . Hypertension Mother   . Ulcerative colitis Mother   . Colon polyps Mother   . Kidney cancer Mother   . Heart disease Maternal Grandmother        myocardial infarction  . Colon cancer Neg Hx   . Breast cancer Neg Hx    Social History   Social History  . Marital status: Married    Spouse name: Zanayah Shadowens  . Number of children: 2  . Years of education: college   Occupational History  . medical transcriptionist   .  Self Employed   Social History Main Topics  . Smoking status: Never Smoker  . Smokeless tobacco: Never Used  . Alcohol use No  . Drug use: No  . Sexual  activity: Yes    Birth control/ protection: Surgical   Other Topics Concern  . None   Social History Narrative  . None    Outpatient Encounter Prescriptions as of 01/29/2017  Medication Sig  . citalopram (CELEXA) 20 MG tablet TAKE 1 TABLET (20 MG TOTAL) BY MOUTH DAILY.  Marland Kitchen ibuprofen (ADVIL,MOTRIN) 200 MG tablet Take 200 mg by mouth every 6 (six) hours as needed.  . meloxicam (MOBIC) 15 MG tablet Take 1 tablet (15 mg total) by mouth daily.  . methylPREDNISolone (MEDROL DOSEPAK) 4 MG TBPK tablet 6 day dose pack - take as directed  . pantoprazole (PROTONIX) 40 MG tablet Take 1 tablet (40 mg total) by mouth 2 (two) times daily before a meal.  . [DISCONTINUED] pantoprazole (PROTONIX) 40 MG tablet TAKE 1 TABLET (40 MG TOTAL) BY MOUTH 2 (TWO) TIMES DAILY BEFORE A MEAL.  Marland Kitchen Cetirizine HCl (ZYRTEC PO) Take 10 mg by mouth as needed.   . triamcinolone cream (KENALOG) 0.1 % Apply 1 application topically 2 (two) times daily.  Marland Kitchen venlafaxine XR (EFFEXOR XR) 37.5 MG 24 hr capsule Take 1 capsule (37.5 mg total) by mouth daily with breakfast.   No  facility-administered encounter medications on file as of 01/29/2017.     Review of Systems  Constitutional: Negative for appetite change and unexpected weight change.  HENT: Negative for congestion and sinus pressure.   Eyes: Negative for pain and visual disturbance.  Respiratory: Negative for cough, chest tightness and shortness of breath.   Cardiovascular: Negative for chest pain, palpitations and leg swelling.  Gastrointestinal: Negative for abdominal pain, diarrhea, nausea and vomiting.  Genitourinary: Negative for difficulty urinating and dysuria.  Musculoskeletal: Negative for back pain and joint swelling.  Skin: Positive for rash. Negative for color change.  Neurological: Negative for dizziness, light-headedness and headaches.  Hematological: Negative for adenopathy. Does not bruise/bleed easily.  Psychiatric/Behavioral: Negative for agitation and  dysphoric mood.       Objective:    Physical Exam  Constitutional: She is oriented to person, place, and time. She appears well-developed and well-nourished. No distress.  HENT:  Nose: Nose normal.  Mouth/Throat: Oropharynx is clear and moist.  Eyes: Right eye exhibits no discharge. Left eye exhibits no discharge. No scleral icterus.  Neck: Neck supple. No thyromegaly present.  Cardiovascular: Normal rate and regular rhythm.   Pulmonary/Chest: Breath sounds normal. No accessory muscle usage. No tachypnea. No respiratory distress. She has no decreased breath sounds. She has no wheezes. She has no rhonchi. Right breast exhibits no inverted nipple, no mass, no nipple discharge and no tenderness (no axillary adenopathy). Left breast exhibits no inverted nipple, no mass, no nipple discharge and no tenderness (no axilarry adenopathy).  Abdominal: Soft. Bowel sounds are normal. There is no tenderness.  Musculoskeletal: She exhibits no edema or tenderness.  Lymphadenopathy:    She has no cervical adenopathy.  Neurological: She is alert and oriented to person, place, and time.  Skin: Skin is warm. Rash noted. No erythema.  Psychiatric: She has a normal mood and affect. Her behavior is normal.    BP 132/80 (BP Location: Left Arm, Patient Position: Sitting, Cuff Size: Large)   Pulse 66   Temp 97.6 F (36.4 C) (Oral)   Resp 12   Ht 5' 8.11" (1.73 m)   Wt 289 lb 12.8 oz (131.5 kg)   SpO2 95%   BMI 43.92 kg/m  Wt Readings from Last 3 Encounters:  01/29/17 289 lb 12.8 oz (131.5 kg)  07/20/16 294 lb (133.4 kg)  01/28/16 (!) 305 lb (138.3 kg)     Lab Results  Component Value Date   WBC 8.7 01/29/2017   HGB 12.6 01/29/2017   HCT 38.9 01/29/2017   PLT 293.0 01/29/2017   GLUCOSE 102 (H) 01/29/2017   CHOL 227 (H) 01/29/2017   TRIG 70.0 01/29/2017   HDL 55.60 01/29/2017   LDLDIRECT 147.8 04/23/2012   LDLCALC 157 (H) 01/29/2017   ALT 53 (H) 01/29/2017   AST 25 01/29/2017   NA 140  01/29/2017   K 3.9 01/29/2017   CL 103 01/29/2017   CREATININE 0.69 01/29/2017   BUN 19 01/29/2017   CO2 28 01/29/2017   TSH 2.31 01/29/2017    Dg Finger Thumb Left  Result Date: 07/20/2016 CLINICAL DATA:  Left hand injury today with bleeding. Distal thumb pain. EXAM: LEFT THUMB 2+V COMPARISON:  None. FINDINGS: The mineralization and alignment are normal. There is no evidence of acute fracture or dislocation. Mild interphalangeal degenerative changes. No focal soft tissue abnormalities are seen. IMPRESSION: No evidence of acute fracture or dislocation. Electronically Signed   By: Richardean Sale M.D.   On: 07/20/2016 15:11  Assessment & Plan:   Problem List Items Addressed This Visit    Anxiety    On citalopram.  Has decreased dose.  Will continue to taper off.  start effexor as outlined.  Doing better.  Follow.        Relevant Medications   venlafaxine XR (EFFEXOR XR) 37.5 MG 24 hr capsule   GERD (gastroesophageal reflux disease)    On protonix.  Still with some break through symptoms occasionally - states if eats the wrong thing.  Overall under reasonable control.  Has never had EGD.  Being referred for screening colonoscopy.  Will have them evaluate to see if EGD warranted.        Relevant Medications   pantoprazole (PROTONIX) 40 MG tablet   Other Relevant Orders   Ambulatory referral to Gastroenterology   Healthcare maintenance    Physical today 01/29/17.  Mammogram 01/03/16 - Birads I.  Schedule f/u mammogram.  PAP 01/28/16 negative with negative HPV.  Discussed the need for colon evaluation.  Refer to GI for colonoscopy.        Hot flashes    Persistent.  Desires medication.  Desires not to use estrogen.  Will start effexor.  Taper citalopram as directed.  Follow.       Hypercholesterolemia    Low cholesterol diet and exercise.  Follow lipid panel.        Relevant Orders   Lipid panel (Completed)   Hypertension    Blood pressure under good control.  Continue same  medication regimen.  Follow pressures.  Follow metabolic panel.        Relevant Orders   CBC with Differential/Platelet (Completed)   Comprehensive metabolic panel (Completed)   TSH (Completed)    Other Visit Diagnoses    Routine general medical examination at a health care facility    -  Primary   Screening for breast cancer       Relevant Orders   MM SCREENING BREAST TOMO BILATERAL   Colon cancer screening       Relevant Orders   Ambulatory referral to Gastroenterology   Rash       appears to be c/w contact dermatitis.  triamcinolone cream.  follow.         Einar Pheasant, MD

## 2017-01-29 NOTE — Telephone Encounter (Signed)
Under transaction history for yesterday 01/28/17 it does show xrays for both the left and the right 3 views $95 each. I guess someone entered the left foot charges in error. She only had an xray on the right foot.  She was reviewing the charges for yesterday in my chart that's how she found it. But the charges are there for both feet. Should I send this to Jocelyn Lamer to correct before the claim goes out?

## 2017-01-29 NOTE — Patient Instructions (Signed)
Continue citalopram 20mg  1/2 tablet per day for one week and then decrease to 1/2 tablet every other day for one week and then stop

## 2017-01-30 ENCOUNTER — Encounter: Payer: Self-pay | Admitting: Internal Medicine

## 2017-01-30 DIAGNOSIS — R945 Abnormal results of liver function studies: Secondary | ICD-10-CM

## 2017-01-30 DIAGNOSIS — R7989 Other specified abnormal findings of blood chemistry: Secondary | ICD-10-CM

## 2017-01-31 DIAGNOSIS — R232 Flushing: Secondary | ICD-10-CM | POA: Insufficient documentation

## 2017-01-31 NOTE — Assessment & Plan Note (Addendum)
On citalopram.  Has decreased dose.  Will continue to taper off.  start effexor as outlined.  Doing better.  Follow.

## 2017-01-31 NOTE — Assessment & Plan Note (Signed)
On protonix.  Still with some break through symptoms occasionally - states if eats the wrong thing.  Overall under reasonable control.  Has never had EGD.  Being referred for screening colonoscopy.  Will have them evaluate to see if EGD warranted.

## 2017-01-31 NOTE — Assessment & Plan Note (Signed)
Blood pressure under good control.  Continue same medication regimen.  Follow pressures.  Follow metabolic panel.   

## 2017-01-31 NOTE — Assessment & Plan Note (Signed)
Low cholesterol diet and exercise.  Follow lipid panel.   

## 2017-01-31 NOTE — Assessment & Plan Note (Signed)
Persistent.  Desires medication.  Desires not to use estrogen.  Will start effexor.  Taper citalopram as directed.  Follow.

## 2017-02-02 NOTE — Telephone Encounter (Signed)
Order placed for abdominal ultrasound.   

## 2017-02-11 ENCOUNTER — Ambulatory Visit: Payer: 59

## 2017-02-17 ENCOUNTER — Ambulatory Visit
Admission: RE | Admit: 2017-02-17 | Discharge: 2017-02-17 | Disposition: A | Discharge status: Home / Self Care | Payer: 59 | Source: Ambulatory Visit | Attending: Internal Medicine | Admitting: Internal Medicine

## 2017-02-17 DIAGNOSIS — Z1231 Encounter for screening mammogram for malignant neoplasm of breast: Secondary | ICD-10-CM | POA: Diagnosis not present

## 2017-02-17 DIAGNOSIS — Z1239 Encounter for other screening for malignant neoplasm of breast: Secondary | ICD-10-CM

## 2017-03-17 ENCOUNTER — Encounter: Payer: Self-pay | Admitting: Internal Medicine

## 2017-03-19 ENCOUNTER — Other Ambulatory Visit: Payer: Self-pay

## 2017-03-19 ENCOUNTER — Telehealth: Payer: Self-pay

## 2017-03-19 MED ORDER — CITALOPRAM HYDROBROMIDE 20 MG PO TABS: 20.0000 mg | ORAL_TABLET | Freq: Every day | ORAL | 0 refills | Status: DC

## 2017-03-19 NOTE — Telephone Encounter (Signed)
rx sent in for citalopram.  Directions given to taper effexor and start back on citalopram.

## 2017-03-19 NOTE — Telephone Encounter (Signed)
See her my chart message.  I sent in her rx for citalopram.

## 2017-03-19 NOTE — Telephone Encounter (Signed)
Last o/v 01/29/17 F/U 05/21/17 Last refill 01/29/17  Requesting 90 day supply

## 2017-04-16 ENCOUNTER — Encounter: Payer: Self-pay | Admitting: Podiatry

## 2017-04-18 NOTE — Telephone Encounter (Signed)
Yes, please prescribe Medrol dose pack and diclofenac if the patient doesn't have any oral antiinflammatory on hand.  Also, Prescribe shertech achilles tendinitis cream. Thanks, Dr. Amalia Hailey

## 2017-04-20 ENCOUNTER — Telehealth: Payer: Self-pay

## 2017-04-20 ENCOUNTER — Telehealth: Payer: Self-pay | Admitting: Podiatry

## 2017-04-20 MED ORDER — NONFORMULARY OR COMPOUNDED ITEM: 2 refills | Status: DC

## 2017-04-20 MED ORDER — METHYLPREDNISOLONE 4 MG PO TABS: 4.0000 mg | ORAL_TABLET | Freq: Every day | ORAL | 0 refills | Status: DC

## 2017-04-20 MED ORDER — DICLOFENAC SODIUM 75 MG PO TBEC: 75.0000 mg | DELAYED_RELEASE_TABLET | Freq: Two times a day (BID) | ORAL | 1 refills | Status: DC

## 2017-04-20 NOTE — Telephone Encounter (Signed)
Returned patient call, per Dr. Amalia Hailey, rx for Medrol dose pack, diclofenac and Achilles tendonits cream (shertech) has been faxed to [facility] [address].

## 2017-05-11 ENCOUNTER — Ambulatory Visit: Payer: 59 | Admitting: Internal Medicine

## 2017-05-20 ENCOUNTER — Other Ambulatory Visit: Payer: Self-pay

## 2017-05-20 MED ORDER — PANTOPRAZOLE SODIUM 40 MG PO TBEC: 40.0000 mg | DELAYED_RELEASE_TABLET | Freq: Two times a day (BID) | ORAL | 1 refills | Status: DC

## 2017-06-25 ENCOUNTER — Other Ambulatory Visit: Payer: Self-pay

## 2017-06-25 MED ORDER — MELOXICAM 15 MG PO TABS: 15.0000 mg | ORAL_TABLET | Freq: Every day | ORAL | 0 refills | Status: DC

## 2017-06-25 NOTE — Telephone Encounter (Signed)
Pharmacy request 90 day refill for Meloxicam.  Per Dr. Milinda Pointer, ok to refill.  Script has been sent to pharmacy

## 2017-06-30 ENCOUNTER — Encounter: Payer: Self-pay | Admitting: Internal Medicine

## 2017-06-30 ENCOUNTER — Telehealth: Payer: Self-pay | Admitting: Gastroenterology

## 2017-06-30 NOTE — Telephone Encounter (Signed)
Patient ready to schedule colonoscopy.

## 2017-07-01 ENCOUNTER — Other Ambulatory Visit: Payer: Self-pay

## 2017-07-01 DIAGNOSIS — Z1211 Encounter for screening for malignant neoplasm of colon: Secondary | ICD-10-CM

## 2017-07-01 NOTE — Telephone Encounter (Signed)
Gastroenterology Pre-Procedure Review  Request Date: 07/24/17 Requesting Physician: Dr. Allen Norris  PATIENT REVIEW QUESTIONS: The patient responded to the following health history questions as indicated:    1. Are you having any GI issues? no 2. Do you have a personal history of Polyps? no 3. Do you have a family history of Colon Cancer or Polyps? yes (mom polyps) 4. Diabetes Mellitus? no 5. Joint replacements in the past 12 months?no 6. Major health problems in the past 3 months?no 7. Any artificial heart valves, MVP, or defibrillator?no    MEDICATIONS & ALLERGIES:    Patient reports the following regarding taking any anticoagulation/antiplatelet therapy:   Plavix, Coumadin, Eliquis, Xarelto, Lovenox, Pradaxa, Brilinta, or Effient? no Aspirin? no  Patient confirms/reports the following medications:  Current Outpatient Medications  Medication Sig Dispense Refill  . Cetirizine HCl (ZYRTEC PO) Take 10 mg by mouth as needed.     . citalopram (CELEXA) 20 MG tablet Take 1 tablet (20 mg total) by mouth daily. 90 tablet 0  . diclofenac (VOLTAREN) 75 MG EC tablet Take 1 tablet (75 mg total) 2 (two) times daily by mouth. 60 tablet 1  . ibuprofen (ADVIL,MOTRIN) 200 MG tablet Take 200 mg by mouth every 6 (six) hours as needed.    . meloxicam (MOBIC) 15 MG tablet Take 1 tablet (15 mg total) by mouth daily. 90 tablet 0  . methylPREDNISolone (MEDROL DOSEPAK) 4 MG TBPK tablet 6 day dose pack - take as directed 21 tablet 0  . methylPREDNISolone (MEDROL) 4 MG tablet Take 1 tablet (4 mg total) daily by mouth. 21 tablet 0  . NONFORMULARY OR COMPOUNDED ITEM See pharmacy note 120 each 2  . pantoprazole (PROTONIX) 40 MG tablet Take 1 tablet (40 mg total) by mouth 2 (two) times daily before a meal. 180 tablet 1  . triamcinolone cream (KENALOG) 0.1 % Apply 1 application topically 2 (two) times daily. 30 g 0   No current facility-administered medications for this visit.     Patient confirms/reports the  following allergies:  Allergies  Allergen Reactions  . Codeine Itching and Other (See Comments)    Hyper Hyper Hyper    No orders of the defined types were placed in this encounter.   AUTHORIZATION INFORMATION Primary Insurance: 1D#: Group #:  Secondary Insurance: 1D#: Group #:  SCHEDULE INFORMATION: Date:07/24/17  Time: Location:[facility]

## 2017-07-20 NOTE — Discharge Instructions (Signed)
General Anesthesia, Adult, Care After °These instructions provide you with information about caring for yourself after your procedure. Your health care provider may also give you more specific instructions. Your treatment has been planned according to current medical practices, but problems sometimes occur. Call your health care provider if you have any problems or questions after your procedure. °What can I expect after the procedure? °After the procedure, it is common to have: °· Vomiting. °· A sore throat. °· Mental slowness. ° °It is common to feel: °· Nauseous. °· Cold or shivery. °· Sleepy. °· Tired. °· Sore or achy, even in parts of your body where you did not have surgery. ° °Follow these instructions at home: °For at least 24 hours after the procedure: °· Do not: °? Participate in activities where you could fall or become injured. °? Drive. °? Use heavy machinery. °? Drink alcohol. °? Take sleeping pills or medicines that cause drowsiness. °? Make important decisions or sign legal documents. °? Take care of children on your own. °· Rest. °Eating and drinking °· If you vomit, drink water, juice, or soup when you can drink without vomiting. °· Drink enough fluid to keep your urine clear or pale yellow. °· Make sure you have little or no nausea before eating solid foods. °· Follow the diet recommended by your health care provider. °General instructions °· Have a responsible adult stay with you until you are awake and alert. °· Return to your normal activities as told by your health care provider. Ask your health care provider what activities are safe for you. °· Take over-the-counter and prescription medicines only as told by your health care provider. °· If you smoke, do not smoke without supervision. °· Keep all follow-up visits as told by your health care provider. This is important. °Contact a health care provider if: °· You continue to have nausea or vomiting at home, and medicines are not helpful. °· You  cannot drink fluids or start eating again. °· You cannot urinate after 8-12 hours. °· You develop a skin rash. °· You have fever. °· You have increasing redness at the site of your procedure. °Get help right away if: °· You have difficulty breathing. °· You have chest pain. °· You have unexpected bleeding. °· You feel that you are having a life-threatening or urgent problem. °This information is not intended to replace advice given to you by your health care provider. Make sure you discuss any questions you have with your health care provider. °Document Released: 08/25/2000 Document Revised: 10/22/2015 Document Reviewed: 05/03/2015 °Elsevier Interactive Patient Education © 2018 [employer] ° °

## 2017-07-24 ENCOUNTER — Ambulatory Visit: Payer: 59 | Admitting: Anesthesiology

## 2017-07-24 ENCOUNTER — Encounter
Admission: RE | Disposition: A | Discharge status: Home / Self Care | Payer: Self-pay | Source: Ambulatory Visit | Attending: Gastroenterology

## 2017-07-24 ENCOUNTER — Ambulatory Visit
Admission: RE | Admit: 2017-07-24 | Discharge: 2017-07-24 | Disposition: A | Discharge status: Home / Self Care | Payer: 59 | Source: Ambulatory Visit | Attending: Gastroenterology | Admitting: Gastroenterology

## 2017-07-24 DIAGNOSIS — Z1211 Encounter for screening for malignant neoplasm of colon: Secondary | ICD-10-CM | POA: Diagnosis present

## 2017-07-24 DIAGNOSIS — Z6841 Body Mass Index (BMI) 40.0 and over, adult: Secondary | ICD-10-CM | POA: Insufficient documentation

## 2017-07-24 DIAGNOSIS — Z713 Dietary counseling and surveillance: Secondary | ICD-10-CM

## 2017-07-24 DIAGNOSIS — E669 Obesity, unspecified: Secondary | ICD-10-CM | POA: Insufficient documentation

## 2017-07-24 DIAGNOSIS — K635 Polyp of colon: Secondary | ICD-10-CM | POA: Diagnosis not present

## 2017-07-24 DIAGNOSIS — I10 Essential (primary) hypertension: Secondary | ICD-10-CM | POA: Insufficient documentation

## 2017-07-24 DIAGNOSIS — D125 Benign neoplasm of sigmoid colon: Secondary | ICD-10-CM

## 2017-07-24 DIAGNOSIS — K640 First degree hemorrhoids: Secondary | ICD-10-CM | POA: Diagnosis not present

## 2017-07-24 DIAGNOSIS — Z79899 Other long term (current) drug therapy: Secondary | ICD-10-CM | POA: Insufficient documentation

## 2017-07-24 DIAGNOSIS — Z8371 Family history of colonic polyps: Secondary | ICD-10-CM | POA: Insufficient documentation

## 2017-07-24 DIAGNOSIS — Z791 Long term (current) use of non-steroidal anti-inflammatories (NSAID): Secondary | ICD-10-CM | POA: Diagnosis not present

## 2017-07-24 HISTORY — PX: COLONOSCOPY WITH PROPOFOL: SHX5780

## 2017-07-24 HISTORY — PX: POLYPECTOMY: SHX5525

## 2017-07-24 SURGERY — COLONOSCOPY WITH PROPOFOL
Anesthesia: General | Site: Rectum | Wound class: Contaminated

## 2017-07-24 MED ORDER — LACTATED RINGERS IV SOLN: 10.0000 mL/h | INTRAVENOUS | Status: DC

## 2017-07-24 MED ORDER — LIDOCAINE HCL (CARDIAC) 20 MG/ML IV SOLN
INTRAVENOUS | Status: DC | PRN
  Administered 2017-07-24: 50 mg via INTRAVENOUS

## 2017-07-24 MED ORDER — PROPOFOL 10 MG/ML IV BOLUS
INTRAVENOUS | Status: DC | PRN
  Administered 2017-07-24 (×2): 20 mg via INTRAVENOUS
  Administered 2017-07-24: 30 mg via INTRAVENOUS
  Administered 2017-07-24 (×4): 50 mg via INTRAVENOUS
  Administered 2017-07-24: 30 mg via INTRAVENOUS
  Administered 2017-07-24: 100 mg via INTRAVENOUS

## 2017-07-24 MED ORDER — LACTATED RINGERS IV SOLN
INTRAVENOUS | Status: DC
  Administered 2017-07-24: 09:00:00 via INTRAVENOUS

## 2017-07-24 MED ORDER — STERILE WATER FOR IRRIGATION IR SOLN
Status: DC | PRN
  Administered 2017-07-24: .5 mL

## 2017-07-24 MED ORDER — SODIUM CHLORIDE 0.9 % IV SOLN: INTRAVENOUS | Status: DC

## 2017-07-24 SURGICAL SUPPLY — 6 items
CANISTER SUCT 1200ML W/VALVE (MISCELLANEOUS) ×4 IMPLANT
FORCEPS BIOP RAD 4 LRG CAP 4 (CUTTING FORCEPS) ×4 IMPLANT
GOWN CVR UNV OPN BCK APRN NK (MISCELLANEOUS) ×4 IMPLANT
GOWN ISOL THUMB LOOP REG UNIV (MISCELLANEOUS) ×6
KIT ENDO PROCEDURE OLY (KITS) ×4 IMPLANT
WATER STERILE IRR 250ML POUR (IV SOLUTION) ×4 IMPLANT

## 2017-07-24 NOTE — Anesthesia Postprocedure Evaluation (Signed)
Anesthesia Post Note  Patient: [name]  Procedure(s) Performed: COLONOSCOPY WITH PROPOFOL (N/A Rectum) POLYPECTOMY (Rectum)  Patient location during evaluation: PACU Anesthesia Type: General Level of consciousness: awake and alert and oriented Pain management: satisfactory to patient Vital Signs Assessment: post-procedure vital signs reviewed and stable Respiratory status: spontaneous breathing, nonlabored ventilation and respiratory function stable Cardiovascular status: blood pressure returned to baseline and stable Postop Assessment: Adequate PO intake and No signs of nausea or vomiting Anesthetic complications: no    Raliegh Ip

## 2017-07-24 NOTE — Op Note (Signed)
[facility] Gastroenterology Patient Name: Jenny Stephenson Procedure Date: 07/24/2017 9:44 AM MRN: [id number] Account #: [id number] Date of Birth: 23-Sep-1965 Admit Type: Outpatient Age: 52 Room: [facility] OR ROOM 01 Gender: Female Note Status: Finalized Procedure:            Colonoscopy Indications:          Screening for colorectal malignant neoplasm Providers:            [name] MD, MD Referring MD:         Einar Pheasant, MD (Referring MD) Medicines:            Propofol per Anesthesia Complications:        No immediate complications. Procedure:            Pre-Anesthesia Assessment:                       - Prior to the procedure, a History and Physical was                        performed, and patient medications and allergies were                        reviewed. The patient's tolerance of previous                        anesthesia was also reviewed. The risks and benefits of                        the procedure and the sedation options and risks were                        discussed with the patient. All questions were                        answered, and informed consent was obtained. Prior                        Anticoagulants: The patient has taken no previous                        anticoagulant or antiplatelet agents. ASA Grade                        Assessment: II - A patient with mild systemic disease.                        After reviewing the risks and benefits, the patient was                        deemed in satisfactory condition to undergo the                        procedure.                       After obtaining informed consent, the colonoscope was                        passed under direct vision. Throughout the procedure,  the patient's blood pressure, pulse, and oxygen                        saturations were monitored continuously. The Olympus CF                        H180AL Colonoscope (S#: [id number]) was introduced  through                        the anus and advanced to the the cecum, identified by                        appendiceal orifice and ileocecal valve. The                        colonoscopy was performed without difficulty. The                        patient tolerated the procedure well. The quality of                        the bowel preparation was excellent. Findings:      The perianal and digital rectal examinations were normal.      Two sessile polyps were found in the sigmoid colon. The polyps were 2 to       3 mm in size. These polyps were removed with a cold biopsy forceps.       Resection and retrieval were complete.      Non-bleeding internal hemorrhoids were found during retroflexion. The       hemorrhoids were Grade I (internal hemorrhoids that do not prolapse). Impression:           - Two 2 to 3 mm polyps in the sigmoid colon, removed                        with a cold biopsy forceps. Resected and retrieved.                       - Non-bleeding internal hemorrhoids. Recommendation:       - Discharge patient to home.                       - Resume previous diet.                       - Continue present medications.                       - Await pathology results.                       - Repeat colonoscopy in 5 years if polyp adenoma and 10                        years if hyperplastic Procedure Code(s):    --- Professional ---                       [phone], Colonoscopy, flexible; with biopsy, single or                        multiple  Diagnosis Code(s):    --- Professional ---                       Z12.11, Encounter for screening for malignant neoplasm                        of colon                       D12.5, Benign neoplasm of sigmoid colon CPT copyright 2016 American Medical Association. All rights reserved. The codes documented in this report are preliminary and upon coder review may  be revised to meet current compliance requirements. [name] MD, MD 07/24/2017  10:07:14 AM This report has been signed electronically. Number of Addenda: 0 Note Initiated On: 07/24/2017 9:44 AM Scope Withdrawal Time: 0 hours 7 minutes 0 seconds  Total Procedure Duration: 0 hours 9 minutes 29 seconds       [facility]

## 2017-07-24 NOTE — Anesthesia Procedure Notes (Signed)
Procedure Name: MAC Date/Time: 07/24/2017 9:46 AM Performed by: Janna Arch, CRNA Pre-anesthesia Checklist: Patient identified, Emergency Drugs available, Suction available and Patient being monitored Patient Re-evaluated:Patient Re-evaluated prior to induction Oxygen Delivery Method: Nasal cannula

## 2017-07-24 NOTE — H&P (Signed)
Jenny Lame, MD [address] [place], [place] [address] Phone: [phone] Fax : [phone]  Primary Care Physician:  Jenny Pheasant, MD Primary Gastroenterologist:  Dr. Allen Stephenson  Pre-Procedure History & Physical: HPI:  Jenny Stephenson is a 52 y.o. female is here for a screening colonoscopy.   Past Medical History:  Diagnosis Date  . Carpal tunnel syndrome    bilateral  . Hypertension   . Obesity   . Sciatica of left side 02/28/2014    Past Surgical History:  Procedure Laterality Date  . arm surgery  1999   screws/plates  . CARPAL TUNNEL RELEASE  2008  . [place] and 2001  . cholescystectomy  1995  . TONSILLECTOMY  1973  . VAGINAL HYSTERECTOMY  2004   ovaries not removed, secondary to fibroids and bleeding    Prior to Admission medications   Medication Sig Start Date End Date Taking? Authorizing Provider  Cetirizine HCl (ZYRTEC PO) Take 10 mg by mouth as needed.    Yes [provider]  citalopram (CELEXA) 20 MG tablet Take 1 tablet (20 mg total) by mouth daily. 03/19/17  Yes Jenny Pheasant, MD  diclofenac (VOLTAREN) 75 MG EC tablet Take 1 tablet (75 mg total) 2 (two) times daily by mouth. 04/20/17  Yes Edrick Kins, DPM  ibuprofen (ADVIL,MOTRIN) 200 MG tablet Take 200 mg by mouth every 6 (six) hours as needed.   Yes [provider]  meloxicam (MOBIC) 15 MG tablet Take 1 tablet (15 mg total) by mouth daily. 06/25/17  Yes Hyatt, Max T, DPM  pantoprazole (PROTONIX) 40 MG tablet Take 1 tablet (40 mg total) by mouth 2 (two) times daily before a meal. 05/20/17  Yes Jenny Pheasant, MD  methylPREDNISolone (MEDROL DOSEPAK) 4 MG TBPK tablet 6 day dose pack - take as directed Patient not taking: Reported on 07/16/2017 01/28/17   Hyatt, Max T, DPM  methylPREDNISolone (MEDROL) 4 MG tablet Take 1 tablet (4 mg total) daily by mouth. Patient not taking: Reported on 07/24/2017 04/20/17   Edrick Kins, DPM  NONFORMULARY OR COMPOUNDED  ITEM See pharmacy note 04/20/17   Edrick Kins, DPM  triamcinolone cream (KENALOG) 0.1 % Apply 1 application topically 2 (two) times daily. Patient not taking: Reported on 07/16/2017 01/29/17   Jenny Pheasant, MD    Allergies as of 07/01/2017 - Review Complete 01/31/2017  Allergen Reaction Noted  . Codeine Itching and Other (See Comments) 04/23/2012    Family History  Problem Relation Age of Onset  . Prostate cancer Father   . Lymphoma Father   . Hypertension Mother   . Ulcerative colitis Mother   . Colon polyps Mother   . Kidney cancer Mother   . Heart disease Maternal Grandmother        myocardial infarction  . Colon cancer Neg Hx   . Breast cancer Neg Hx     Social History   Socioeconomic History  . Marital status: Married    Spouse name: Jenny Stephenson  . Number of children: 2  . Years of education: college  . Highest education level: Not on file  Social Needs  . Financial resource strain: Not on file  . Food insecurity - worry: Not on file  . Food insecurity - inability: Not on file  . Transportation needs - medical: Not on file  . Transportation needs - non-medical: Not on file  Occupational History  . Occupation: Scientist, forensic: [employer]  Tobacco Use  . Smoking status: Never Smoker  . Smokeless tobacco: Never Used  Substance and Sexual Activity  . Alcohol use: No    Alcohol/week: 0.0 oz  . Drug use: No  . Sexual activity: Yes    Birth control/protection: Surgical  Other Topics Concern  . Not on file  Social History Narrative  . Not on file    Review of Systems: See HPI, otherwise negative ROS  Physical Exam: BP (!) 150/89   Pulse 69   Temp 97.7 F (36.5 C)   Resp 16   Ht 5\' 8"  (1.727 m)   Wt 300 lb (136.1 kg)   SpO2 100%   BMI 45.61 kg/m  General:   Alert,  pleasant and cooperative in NAD Head:  Normocephalic and atraumatic. Neck:  Supple; no masses or thyromegaly. Lungs:  Clear throughout to auscultation.      Heart:  Regular rate and rhythm. Abdomen:  Soft, nontender and nondistended. Normal bowel sounds, without guarding, and without rebound.   Neurologic:  Alert and  oriented x4;  grossly normal neurologically.  Impression/Plan: Jenny Stephenson is now here to undergo a screening colonoscopy.  Risks, benefits, and alternatives regarding colonoscopy have been reviewed with the patient.  Questions have been answered.  All parties agreeable.

## 2017-07-24 NOTE — Transfer of Care (Signed)
Immediate Anesthesia Transfer of Care Note  Patient: Jenny Stephenson  Procedure(s) Performed: COLONOSCOPY WITH PROPOFOL (N/A Rectum)  Patient Location: PACU  Anesthesia Type: General  Level of Consciousness: awake, alert  and patient cooperative  Airway and Oxygen Therapy: Patient Spontanous Breathing and Patient connected to supplemental oxygen  Post-op Assessment: Post-op Vital signs reviewed, Patient's Cardiovascular Status Stable, Respiratory Function Stable, Patent Airway and No signs of Nausea or vomiting  Post-op Vital Signs: Reviewed and stable  Complications: No apparent anesthesia complications

## 2017-07-24 NOTE — Anesthesia Preprocedure Evaluation (Signed)
Anesthesia Evaluation  Patient identified by MRN, date of birth, ID band Patient awake    Reviewed: Allergy & Precautions, H&P , NPO status , Patient's Chart, lab work & pertinent test results  Airway Mallampati: III  TM Distance: >3 FB Neck ROM: full    Dental no notable dental hx.    Pulmonary    Pulmonary exam normal breath sounds clear to auscultation       Cardiovascular Normal cardiovascular exam Rhythm:regular Rate:Normal     Neuro/Psych Anxiety    GI/Hepatic GERD  ,  Endo/Other  Morbid obesity  Renal/GU      Musculoskeletal   Abdominal   Peds  Hematology   Anesthesia Other Findings   Reproductive/Obstetrics                             Anesthesia Physical Anesthesia Plan  ASA: II  Anesthesia Plan: General   Post-op Pain Management:    Induction: Intravenous  PONV Risk Score and Plan: 3 and Propofol infusion and Treatment may vary due to age or medical condition  Airway Management Planned: Natural Airway  Additional Equipment:   Intra-op Plan:   Post-operative Plan:   Informed Consent: I have reviewed the patients History and Physical, chart, labs and discussed the procedure including the risks, benefits and alternatives for the proposed anesthesia with the patient or authorized representative who has indicated his/her understanding and acceptance.     Plan Discussed with: CRNA  Anesthesia Plan Comments:         Anesthesia Quick Evaluation

## 2017-07-27 ENCOUNTER — Encounter: Payer: Self-pay | Admitting: Gastroenterology

## 2017-07-27 ENCOUNTER — Other Ambulatory Visit: Payer: Self-pay

## 2017-07-27 MED ORDER — DICLOFENAC SODIUM 75 MG PO TBEC: 75.0000 mg | DELAYED_RELEASE_TABLET | Freq: Two times a day (BID) | ORAL | 1 refills | Status: DC

## 2017-07-27 NOTE — Telephone Encounter (Signed)
Pharmacy requesting 90 day supply refill for Diclofenac.  Per Dr. Milinda Pointer, ok to refill.  Patient has follow up appt on 08/12/17 with Dr. Milinda Pointer.    Script has been sent to pharmacy

## 2017-07-28 ENCOUNTER — Encounter: Payer: Self-pay | Admitting: Gastroenterology

## 2017-07-29 ENCOUNTER — Encounter: Payer: Self-pay | Admitting: Gastroenterology

## 2017-08-10 ENCOUNTER — Telehealth: Payer: 59 | Admitting: Family

## 2017-08-10 DIAGNOSIS — B9689 Other specified bacterial agents as the cause of diseases classified elsewhere: Secondary | ICD-10-CM

## 2017-08-10 DIAGNOSIS — J028 Acute pharyngitis due to other specified organisms: Secondary | ICD-10-CM

## 2017-08-10 MED ORDER — BENZONATATE 100 MG PO CAPS: 100.0000 mg | ORAL_CAPSULE | Freq: Three times a day (TID) | ORAL | 0 refills | Status: DC | PRN

## 2017-08-10 MED ORDER — PREDNISONE 5 MG PO TABS: 5.0000 mg | ORAL_TABLET | ORAL | 0 refills | Status: DC

## 2017-08-10 MED ORDER — AMOXICILLIN-POT CLAVULANATE 875-125 MG PO TABS: 1.0000 | ORAL_TABLET | Freq: Two times a day (BID) | ORAL | 0 refills | Status: AC

## 2017-08-10 NOTE — Progress Notes (Signed)
Thank you for the details you included in the comment boxes. Those details are very helpful in determining the best course of treatment for you and help [place] to provide the best care.  We are sorry that you are not feeling well.  Here is how we plan to help!  Based on your presentation I believe you most likely have A cough due to bacteria.  When patients have a fever and a productive cough with a change in color or increased sputum production, we are concerned about bacterial bronchitis.  If left untreated it can progress to pneumonia.  If your symptoms do not improve with your treatment plan it is important that you contact your provider.   I have prescribed Augmentin 875mg /125mg  twice daily for 7 days per your request.  In addition you may use A non-prescription cough medication called Mucinex DM: take 2 tablets every 12 hours. and A prescription cough medication called Tessalon Perles 100mg . You may take 1-2 capsules every 8 hours as needed for your cough.  Sterapred 5 mg dosepak  From your responses in the eVisit questionnaire you describe inflammation in the upper respiratory tract which is causing a significant cough.  This is commonly called Bronchitis and has four common causes:    Allergies  Viral Infections  Acid Reflux  Bacterial Infection Allergies, viruses and acid reflux are treated by controlling symptoms or eliminating the cause. An example might be a cough caused by taking certain blood pressure medications. You stop the cough by changing the medication. Another example might be a cough caused by acid reflux. Controlling the reflux helps control the cough.  USE OF BRONCHODILATOR ("RESCUE") INHALERS: There is a risk from using your bronchodilator too frequently.  The risk is that over-reliance on a medication which only relaxes the muscles surrounding the breathing tubes can reduce the effectiveness of medications prescribed to reduce swelling and congestion of the tubes  themselves.  Although you feel brief relief from the bronchodilator inhaler, your asthma may actually be worsening with the tubes becoming more swollen and filled with mucus.  This can delay other crucial treatments, such as oral steroid medications. If you need to use a bronchodilator inhaler daily, several times per day, you should discuss this with your provider.  There are probably better treatments that could be used to keep your asthma under control.     HOME CARE . Only take medications as instructed by your medical team. . Complete the entire course of an antibiotic. . Drink plenty of fluids and get plenty of rest. . Avoid close contacts especially the very young and the elderly . Cover your mouth if you cough or cough into your sleeve. . Always remember to wash your hands . A steam or ultrasonic humidifier can help congestion.   GET HELP RIGHT AWAY IF: . You develop worsening fever. . You become short of breath . You cough up blood. . Your symptoms persist after you have completed your treatment plan MAKE SURE YOU   Understand these instructions.  Will watch your condition.  Will get help right away if you are not doing well or get worse.  Your e-visit answers were reviewed by a board certified advanced clinical practitioner to complete your personal care plan.  Depending on the condition, your plan could have included both over the counter or prescription medications. If there is a problem please reply  once you have received a response from your provider. Your safety is important to [place].  If  you have drug allergies check your prescription carefully.    You can use MyChart to ask questions about today's visit, request a non-urgent call back, or ask for a work or school excuse for 24 hours related to this e-Visit. If it has been greater than 24 hours you will need to follow up with your provider, or enter a new e-Visit to address those concerns. You will get an e-mail in the next  two days asking about your experience.  I hope that your e-visit has been valuable and will speed your recovery. Thank you for using e-visits.

## 2017-08-12 ENCOUNTER — Ambulatory Visit (INDEPENDENT_AMBULATORY_CARE_PROVIDER_SITE_OTHER): Payer: 59 | Admitting: Podiatry

## 2017-08-12 ENCOUNTER — Encounter: Payer: Self-pay | Admitting: Podiatry

## 2017-08-12 DIAGNOSIS — M722 Plantar fascial fibromatosis: Secondary | ICD-10-CM

## 2017-08-12 DIAGNOSIS — M7661 Achilles tendinitis, right leg: Secondary | ICD-10-CM

## 2017-08-12 MED ORDER — ALBUTEROL SULFATE HFA 108 (90 BASE) MCG/ACT IN AERS: 2.0000 | INHALATION_SPRAY | Freq: Four times a day (QID) | RESPIRATORY_TRACT | 0 refills | Status: DC | PRN

## 2017-08-12 NOTE — Addendum Note (Signed)
Addended by: Chevis Pretty on: 08/12/2017 08:24 AM   Modules accepted: Orders

## 2017-08-12 NOTE — Progress Notes (Signed)
She presents today states that she has continued pain in the right foot.  The pain is worse than ever in my right foot never got better for the last time I was here.  She states that she is trying diclofenac ice stretching all to no avail.  Objective: Vital signs are stable she is alert and oriented x3.  Pulses are palpable.  She still has pain on palpation medial calcaneal tubercle of the right heel today.  No open lesions or wounds are noted.  She also has pain on palpation to the posterior lateral aspect of the Achilles right  Assessment chronic intractable plantar fasciitis right.  Achilles tendinitis bursitis right  Plan: Recommended an MRI she declined.  She did request an injection at this point after consent we injected 20 mg Kenalog 5 mg Marcaine to the point of maximal tenderness medial aspect of the right foot.  She tolerated this well.  We discussed appropriate shoe gear stretching exercises ice therapy as your modifications once again.  Follow-up with her in the near future for surgical intervention if necessary.  I also injected small bursa with 2 mg of dexamethasone after local anesthetic to the distal lateral most aspect of the Achilles.  She tolerated procedure well.

## 2017-08-24 IMAGING — DX DG CHEST 2V
2 series · 2 of 2 positions shown · non-contrast
Comparison: None.

CLINICAL DATA: Acute onset of cough, chest congestion and wheezing.

EXAM:
CHEST  2 VIEW

[chest pa]
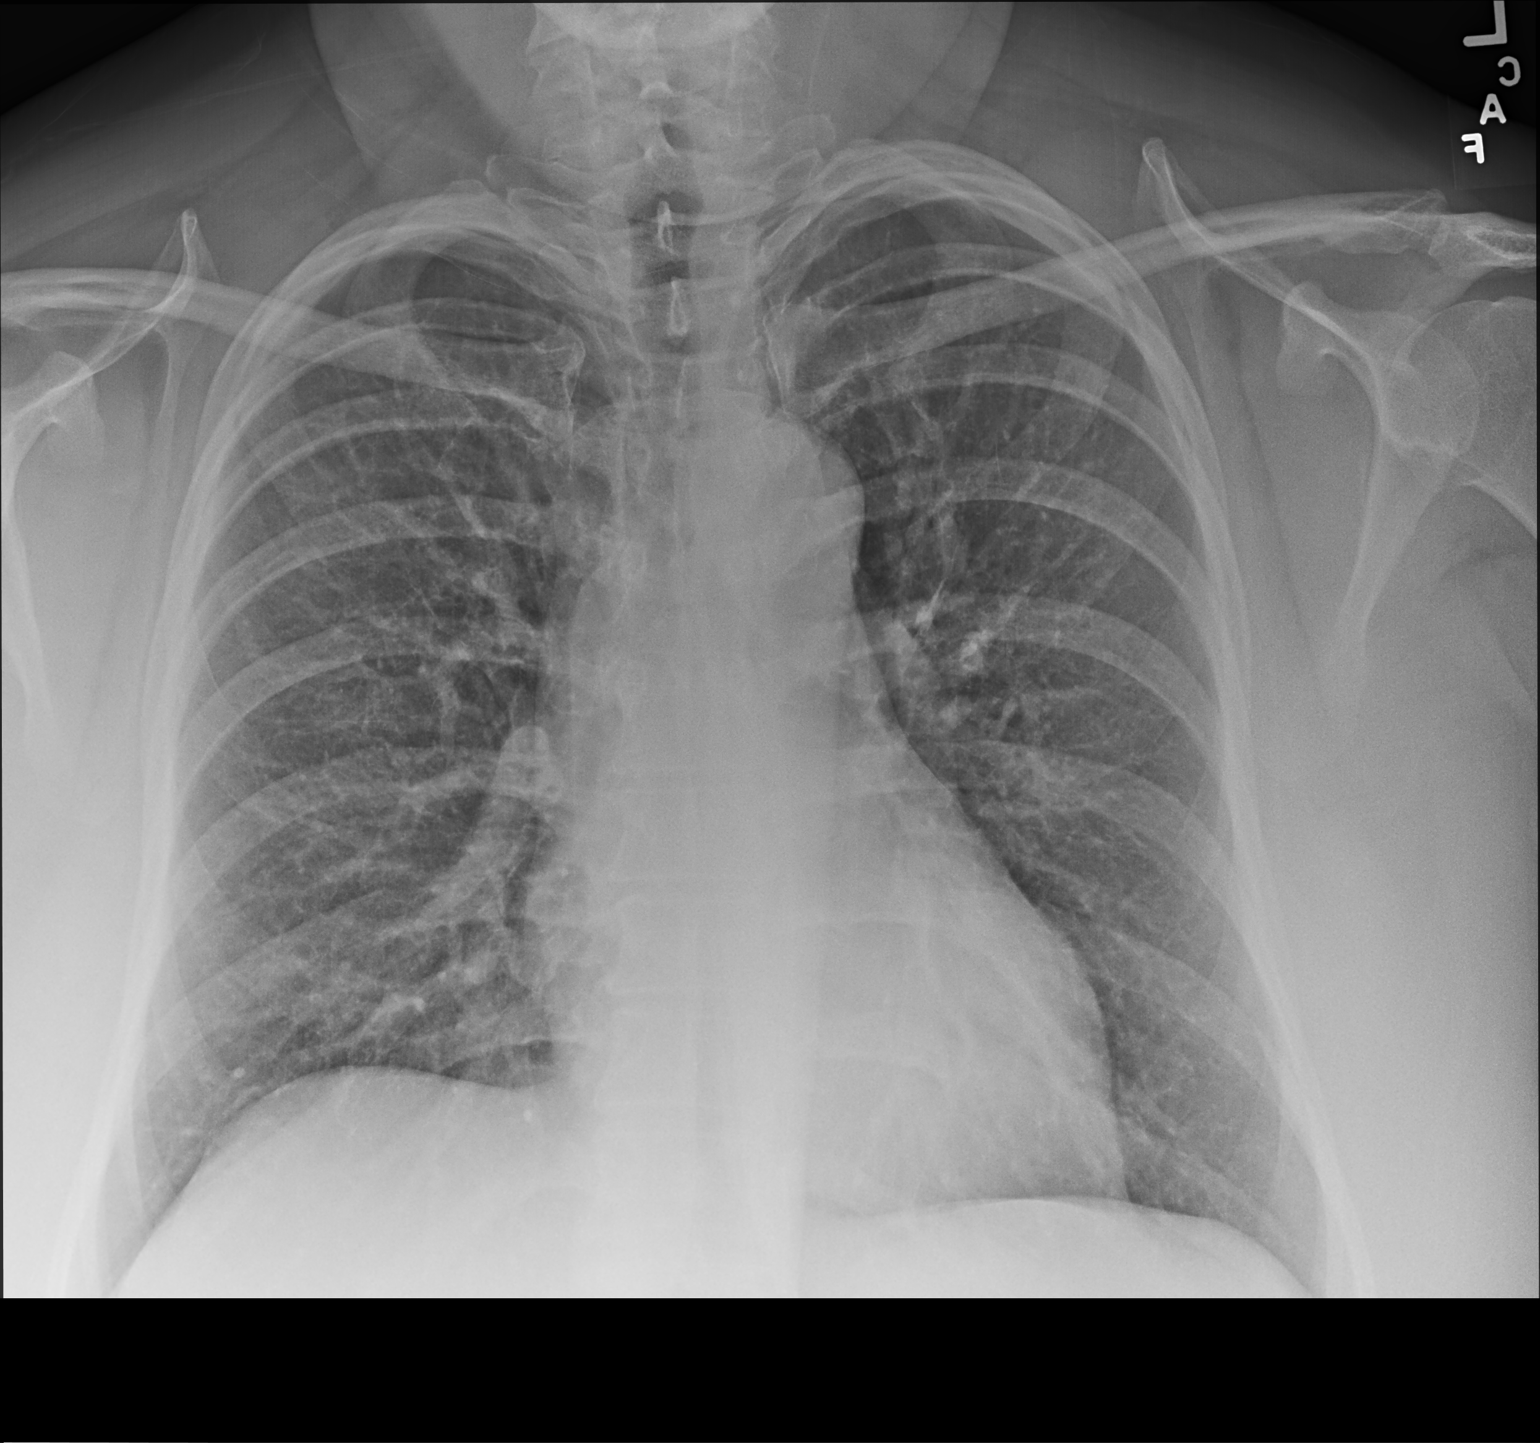

[chest lat]
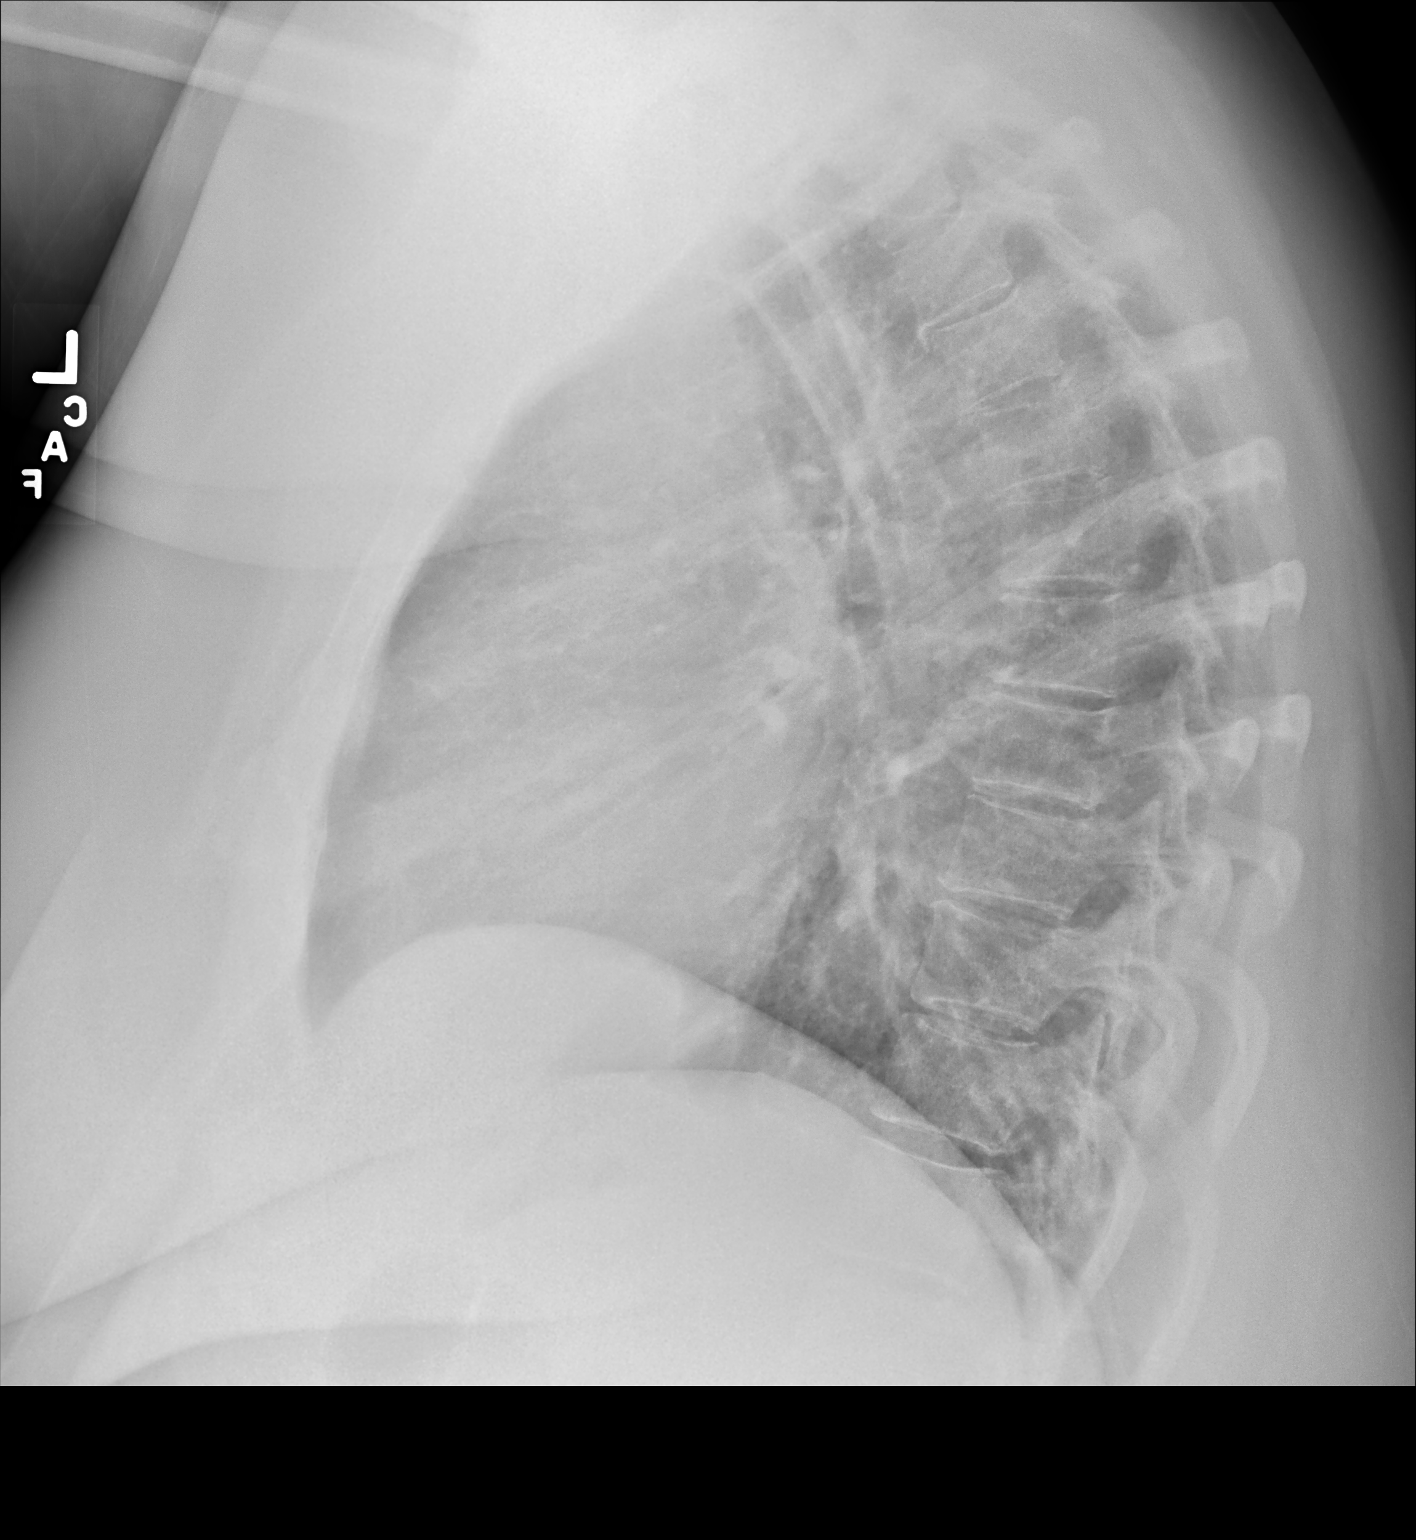

[2 of 2 positions shown; findings below may reference images not displayed]

FINDINGS: Cardiac silhouette upper normal in size. Hilar and mediastinal
contours otherwise unremarkable. Lungs clear. Bronchovascular
markings normal. Pulmonary vascularity normal. No visible pleural
effusions. No pneumothorax. Minimal degenerative changes involving
the thoracic spine.
IMPRESSION: No acute cardiopulmonary disease.

## 2017-09-05 ENCOUNTER — Other Ambulatory Visit: Payer: Self-pay | Admitting: Nurse Practitioner

## 2017-09-07 ENCOUNTER — Other Ambulatory Visit: Payer: Self-pay | Admitting: Internal Medicine

## 2017-09-21 ENCOUNTER — Other Ambulatory Visit: Payer: Self-pay | Admitting: Podiatry

## 2017-11-19 ENCOUNTER — Encounter: Payer: Self-pay | Admitting: Internal Medicine

## 2017-11-26 ENCOUNTER — Encounter: Payer: Self-pay | Admitting: Internal Medicine

## 2017-11-27 ENCOUNTER — Other Ambulatory Visit: Payer: Self-pay | Admitting: Internal Medicine

## 2018-02-08 ENCOUNTER — Other Ambulatory Visit: Payer: Self-pay | Admitting: Internal Medicine

## 2018-02-08 DIAGNOSIS — Z1231 Encounter for screening mammogram for malignant neoplasm of breast: Secondary | ICD-10-CM

## 2018-02-16 ENCOUNTER — Encounter: Payer: Self-pay | Admitting: Internal Medicine

## 2018-02-18 ENCOUNTER — Ambulatory Visit
Admission: RE | Admit: 2018-02-18 | Discharge: 2018-02-18 | Disposition: A | Discharge status: Home / Self Care | Payer: 59 | Source: Ambulatory Visit | Attending: Internal Medicine | Admitting: Internal Medicine

## 2018-02-18 DIAGNOSIS — Z1231 Encounter for screening mammogram for malignant neoplasm of breast: Secondary | ICD-10-CM

## 2018-02-18 NOTE — Telephone Encounter (Signed)
Please confirm with pt she is doing ok and no new issues, etc, will complete form, but does need to schedule a f/u/physical with me.

## 2018-02-19 NOTE — Telephone Encounter (Signed)
Patient is ok and not having any issues, will schedule appt around beginning of the year

## 2018-02-24 ENCOUNTER — Other Ambulatory Visit: Payer: Self-pay | Admitting: Internal Medicine

## 2018-03-08 ENCOUNTER — Telehealth: Payer: Self-pay | Admitting: Internal Medicine

## 2018-03-08 NOTE — Telephone Encounter (Signed)
Inquiring about paper work

## 2018-03-08 NOTE — Telephone Encounter (Signed)
Copied from [place] [id number]. Topic: General - Other >> Mar 08, 2018 12:38 PM Valla Leaver wrote: Reason for CRM: Patient faxed physical assessment form for work on 03/01/2018 around 8am. Wants to know the status if it was received or if it's in process. Wants to pick it up in office once completed.

## 2018-03-09 NOTE — Telephone Encounter (Signed)
Have you seen this paperwork?

## 2018-03-09 NOTE — Telephone Encounter (Signed)
Spoke with patient she states she will re-fax form to office. She states it has to be completed October 15th

## 2018-03-09 NOTE — Telephone Encounter (Signed)
I have not received this form.  If do not have, can she fax again.

## 2018-03-10 NOTE — Telephone Encounter (Signed)
Form received via fax. placed in Dr. Bary Leriche color folder up front. Please advise pt when complete  Last Office visit 01/29/17

## 2018-03-10 NOTE — Telephone Encounter (Signed)
I have not seen her in over one year.  Would need to be seen to have form completed.  I can see her Monday at 4:00.

## 2018-03-10 NOTE — Telephone Encounter (Signed)
Form placed in red folder  

## 2018-03-11 NOTE — Telephone Encounter (Signed)
PCP advised patient in [facility] chart message on 02/18/18 that if nothing had changed you would complete form, she does not have insurance now and will not until January when her insurance kicks in. Patient has had Mammogram and colonoscopy , she tried to get an appointment in September before losing her insurance but could not get in.

## 2018-03-12 ENCOUNTER — Encounter: Payer: Self-pay | Admitting: Internal Medicine

## 2018-03-12 NOTE — Telephone Encounter (Signed)
Patient says the form and jb are costing more than she will make she does not want either

## 2018-03-12 NOTE — Telephone Encounter (Signed)
Form completed, but it appears she needs TB skin test.  If she comes in today, can be read Monday. See me if questions.

## 2018-03-16 NOTE — Telephone Encounter (Signed)
This is the patient we dicussed.

## 2018-03-29 ENCOUNTER — Encounter: Payer: 59 | Admitting: Internal Medicine

## 2018-05-27 ENCOUNTER — Other Ambulatory Visit: Payer: Self-pay | Admitting: Internal Medicine

## 2018-06-11 ENCOUNTER — Telehealth: Payer: 59 | Admitting: Family

## 2018-06-11 DIAGNOSIS — J208 Acute bronchitis due to other specified organisms: Secondary | ICD-10-CM | POA: Diagnosis not present

## 2018-06-11 DIAGNOSIS — J028 Acute pharyngitis due to other specified organisms: Secondary | ICD-10-CM

## 2018-06-11 DIAGNOSIS — B9689 Other specified bacterial agents as the cause of diseases classified elsewhere: Secondary | ICD-10-CM

## 2018-06-11 MED ORDER — ALBUTEROL SULFATE HFA 108 (90 BASE) MCG/ACT IN AERS: INHALATION_SPRAY | RESPIRATORY_TRACT | 0 refills | Status: DC

## 2018-06-11 MED ORDER — PREDNISONE 5 MG PO TABS: 5.0000 mg | ORAL_TABLET | ORAL | 0 refills | Status: DC

## 2018-06-11 MED ORDER — DOXYCYCLINE HYCLATE 100 MG PO TABS: 100.0000 mg | ORAL_TABLET | Freq: Two times a day (BID) | ORAL | 0 refills | Status: DC

## 2018-06-11 MED ORDER — BENZONATATE 100 MG PO CAPS: 100.0000 mg | ORAL_CAPSULE | Freq: Three times a day (TID) | ORAL | 0 refills | Status: DC | PRN

## 2018-06-11 NOTE — Addendum Note (Signed)
Addended by: Guadelupe Sabin C on: 06/11/2018 10:14 AM   Modules accepted: Orders

## 2018-06-11 NOTE — Progress Notes (Signed)
Thank you for the details you included in the comment boxes. Those details are very helpful in determining the best course of treatment for you and help [place] to provide the best care. I agree that antibiotics would be wise at this time. However, you should know that a Z-pack is very dangerous with any SSRI such as your Celexa and could actually result in death due to cardiac rhythm changes. I will avoid Augmentin per your request, and there is another option beyond a Z-pack.  We are sorry that you are not feeling well.  Here is how we plan to help!  Based on your presentation I believe you most likely have A cough due to bacteria.  When patients have a fever and a productive cough with a change in color or increased sputum production, we are concerned about bacterial bronchitis.  If left untreated it can progress to pneumonia.  If your symptoms do not improve with your treatment plan it is important that you contact your provider.   I have prescribed Doxycycline 100 mg twice a day for 7 days     In addition you may use A non-prescription cough medication called Mucinex DM: take 2 tablets every 12 hours. and A prescription cough medication called Tessalon Perles 100mg . You may take 1-2 capsules every 8 hours as needed for your cough.  I have also added an Albuterol inhaler, take 2 puffs every 6 hours as needed for shortness of breath.    Prednisone 5 mg daily for 6 days (see taper instructions below)  Directions for 6 day taper: Day 1: 2 tablets before breakfast, 1 after both lunch & dinner and 2 at bedtime Day 2: 1 tab before breakfast, 1 after both lunch & dinner and 2 at bedtime Day 3: 1 tab at each meal & 1 at bedtime Day 4: 1 tab at breakfast, 1 at lunch, 1 at bedtime Day 5: 1 tab at breakfast & 1 tab at bedtime Day 6: 1 tab at breakfast   From your responses in the eVisit questionnaire you describe inflammation in the upper respiratory tract which is causing a significant cough.  This is  commonly called Bronchitis and has four common causes:    Allergies  Viral Infections  Acid Reflux  Bacterial Infection Allergies, viruses and acid reflux are treated by controlling symptoms or eliminating the cause. An example might be a cough caused by taking certain blood pressure medications. You stop the cough by changing the medication. Another example might be a cough caused by acid reflux. Controlling the reflux helps control the cough.  USE OF BRONCHODILATOR ("RESCUE") INHALERS: There is a risk from using your bronchodilator too frequently.  The risk is that over-reliance on a medication which only relaxes the muscles surrounding the breathing tubes can reduce the effectiveness of medications prescribed to reduce swelling and congestion of the tubes themselves.  Although you feel brief relief from the bronchodilator inhaler, your asthma may actually be worsening with the tubes becoming more swollen and filled with mucus.  This can delay other crucial treatments, such as oral steroid medications. If you need to use a bronchodilator inhaler daily, several times per day, you should discuss this with your provider.  There are probably better treatments that could be used to keep your asthma under control.     HOME CARE . Only take medications as instructed by your medical team. . Complete the entire course of an antibiotic. . Drink plenty of fluids and get plenty of  rest. . Avoid close contacts especially the very young and the elderly . Cover your mouth if you cough or cough into your sleeve. . Always remember to wash your hands . A steam or ultrasonic humidifier can help congestion.   GET HELP RIGHT AWAY IF: . You develop worsening fever. . You become short of breath . You cough up blood. . Your symptoms persist after you have completed your treatment plan MAKE SURE YOU   Understand these instructions.  Will watch your condition.  Will get help right away if you are not doing  well or get worse.  Your e-visit answers were reviewed by a board certified advanced clinical practitioner to complete your personal care plan.  Depending on the condition, your plan could have included both over the counter or prescription medications. If there is a problem please reply  once you have received a response from your provider. Your safety is important to [place].  If you have drug allergies check your prescription carefully.    You can use MyChart to ask questions about today's visit, request a non-urgent call back, or ask for a work or school excuse for 24 hours related to this e-Visit. If it has been greater than 24 hours you will need to follow up with your provider, or enter a new e-Visit to address those concerns. You will get an e-mail in the next two days asking about your experience.  I hope that your e-visit has been valuable and will speed your recovery. Thank you for using e-visits.

## 2018-10-06 ENCOUNTER — Other Ambulatory Visit: Payer: Self-pay | Admitting: Internal Medicine

## 2018-10-08 MED ORDER — PANTOPRAZOLE SODIUM 40 MG PO TBEC: 40.0000 mg | DELAYED_RELEASE_TABLET | Freq: Two times a day (BID) | ORAL | 0 refills | Status: DC

## 2019-01-08 ENCOUNTER — Other Ambulatory Visit: Payer: Self-pay | Admitting: Internal Medicine

## 2019-03-02 ENCOUNTER — Encounter: Payer: 59 | Admitting: Internal Medicine

## 2019-03-25 ENCOUNTER — Telehealth: Payer: BC Managed Care – PPO | Admitting: Nurse Practitioner

## 2019-03-25 DIAGNOSIS — H109 Unspecified conjunctivitis: Secondary | ICD-10-CM | POA: Diagnosis not present

## 2019-03-25 MED ORDER — POLYMYXIN B-TRIMETHOPRIM 10000-0.1 UNIT/ML-% OP SOLN: 2.0000 [drp] | Freq: Four times a day (QID) | OPHTHALMIC | 0 refills | Status: AC

## 2019-03-25 NOTE — Progress Notes (Signed)

## 2019-05-23 ENCOUNTER — Other Ambulatory Visit: Payer: Self-pay | Admitting: Internal Medicine

## 2019-05-23 DIAGNOSIS — Z1231 Encounter for screening mammogram for malignant neoplasm of breast: Secondary | ICD-10-CM

## 2019-05-30 ENCOUNTER — Ambulatory Visit
Admission: RE | Admit: 2019-05-30 | Discharge: 2019-05-30 | Disposition: A | Discharge status: Home / Self Care | Payer: BC Managed Care – PPO | Source: Ambulatory Visit | Attending: Internal Medicine | Admitting: Internal Medicine

## 2019-05-30 DIAGNOSIS — Z1231 Encounter for screening mammogram for malignant neoplasm of breast: Secondary | ICD-10-CM

## 2019-05-31 ENCOUNTER — Ambulatory Visit: Payer: BC Managed Care – PPO

## 2019-08-12 ENCOUNTER — Other Ambulatory Visit (HOSPITAL_COMMUNITY)
Admission: RE | Admit: 2019-08-12 | Discharge: 2019-08-12 | Disposition: A | Discharge status: Home / Self Care | Payer: BC Managed Care – PPO | Source: Ambulatory Visit | Attending: Internal Medicine | Admitting: Internal Medicine

## 2019-08-12 ENCOUNTER — Ambulatory Visit (INDEPENDENT_AMBULATORY_CARE_PROVIDER_SITE_OTHER): Payer: BC Managed Care – PPO | Admitting: Internal Medicine

## 2019-08-12 ENCOUNTER — Other Ambulatory Visit: Payer: Self-pay

## 2019-08-12 ENCOUNTER — Encounter: Payer: Self-pay | Admitting: Internal Medicine

## 2019-08-12 VITALS — BP 132/84 | HR 73 | Temp 97.5°F | Resp 16 | Ht 68.0 in | Wt 311.6 lb

## 2019-08-12 DIAGNOSIS — E7800 Pure hypercholesterolemia, unspecified: Secondary | ICD-10-CM

## 2019-08-12 DIAGNOSIS — Z124 Encounter for screening for malignant neoplasm of cervix: Secondary | ICD-10-CM | POA: Insufficient documentation

## 2019-08-12 DIAGNOSIS — R232 Flushing: Secondary | ICD-10-CM

## 2019-08-12 DIAGNOSIS — K219 Gastro-esophageal reflux disease without esophagitis: Secondary | ICD-10-CM | POA: Diagnosis not present

## 2019-08-12 DIAGNOSIS — Z0000 Encounter for general adult medical examination without abnormal findings: Secondary | ICD-10-CM | POA: Diagnosis not present

## 2019-08-12 LAB — COMPREHENSIVE METABOLIC PANEL
ALT: 33 U/L (ref 0–35)
AST: 22 U/L (ref 0–37)
Albumin: 4.3 g/dL (ref 3.5–5.2)
Alkaline Phosphatase: 97 U/L (ref 39–117)
BUN: 18 mg/dL (ref 6–23)
CO2: 27 mEq/L (ref 19–32)
Calcium: 9.7 mg/dL (ref 8.4–10.5)
Chloride: 101 mEq/L (ref 96–112)
Creatinine, Ser: 0.77 mg/dL (ref 0.40–1.20)
GFR: 78.39 mL/min (ref >60.00)
Glucose, Bld: 99 mg/dL (ref 70–99)
Potassium: 3.7 mEq/L (ref 3.5–5.1)
Sodium: 138 mEq/L (ref 135–145)
Total Bilirubin: 0.7 mg/dL (ref 0.2–1.2)
Total Protein: 7.3 g/dL (ref 6.0–8.3)

## 2019-08-12 LAB — LIPID PANEL
Cholesterol: 193 mg/dL (ref 0–200)
HDL: 49.7 mg/dL (ref >39.00)
LDL Cholesterol: 124 mg/dL — ABNORMAL HIGH (ref 0–99)
NonHDL: 143.34
Total CHOL/HDL Ratio: 4
Triglycerides: 98 mg/dL (ref 0.0–149.0)
VLDL: 19.6 mg/dL (ref 0.0–40.0)

## 2019-08-12 LAB — CBC WITH DIFFERENTIAL/PLATELET
Basophils Absolute: 0.1 10*3/uL (ref 0.0–0.1)
Basophils Relative: 0.8 % (ref 0.0–3.0)
Eosinophils Absolute: 0.3 10*3/uL (ref 0.0–0.7)
Eosinophils Relative: 4 % (ref 0.0–5.0)
HCT: 40.9 % (ref 36.0–46.0)
Hemoglobin: 13.5 g/dL (ref 12.0–15.0)
Lymphocytes Relative: 31.5 % (ref 12.0–46.0)
Lymphs Abs: 2.4 10*3/uL (ref 0.7–4.0)
MCHC: 33 g/dL (ref 30.0–36.0)
MCV: 81.9 fl (ref 78.0–100.0)
Monocytes Absolute: 0.4 10*3/uL (ref 0.1–1.0)
Monocytes Relative: 5.8 % (ref 3.0–12.0)
Neutro Abs: 4.5 10*3/uL (ref 1.4–7.7)
Neutrophils Relative %: 57.9 % (ref 43.0–77.0)
Platelets: 306 10*3/uL (ref 150.0–400.0)
RBC: 5 Mil/uL (ref 3.87–5.11)
RDW: 14.8 % (ref 11.5–15.5)
WBC: 7.7 10*3/uL (ref 4.0–10.5)

## 2019-08-12 LAB — TSH: TSH: 2.05 u[IU]/mL (ref 0.35–4.50)

## 2019-08-12 MED ORDER — FLUTICASONE PROPIONATE 50 MCG/ACT NA SUSP: 2.0000 | Freq: Every day | NASAL | 6 refills | Status: DC

## 2019-08-12 MED ORDER — PANTOPRAZOLE SODIUM 40 MG PO TBEC: 40.0000 mg | DELAYED_RELEASE_TABLET | Freq: Two times a day (BID) | ORAL | 1 refills | Status: DC

## 2019-08-12 MED ORDER — ALPRAZOLAM 0.25 MG PO TABS: 0.2500 mg | ORAL_TABLET | Freq: Every day | ORAL | 0 refills | Status: DC | PRN

## 2019-08-12 MED ORDER — VENLAFAXINE HCL ER 37.5 MG PO CP24: 37.5000 mg | ORAL_CAPSULE | Freq: Every day | ORAL | 2 refills | Status: DC

## 2019-08-12 NOTE — Assessment & Plan Note (Addendum)
Physical today 08/12/19.  Mammogram 05/30/19 - [facility] I.  Colonoscopy 07/2017.  Recommended f/u - per pt - 5 years.  PAP 08/12/19.

## 2019-08-12 NOTE — Progress Notes (Signed)
Patient ID: Jenny Stephenson, female   DOB: 11-10-1965, 54 y.o.   MRN: [id number]   Subjective:    Patient ID: Jenny Stephenson, female    DOB: August 05, 1965, 54 y.o.   MRN: [id number]  HPI  Patient here for her physical.  She reports she is doing relatively well.  Does report issues with not sleeping and hot flashes.  Discussed treatment options.  Discussed effexor.  No chest pain or sob reported.  Some intermittent wheezing - 4-5 years.  Discussed acid reflux.  Discussed taking protonix bid.  No abdominal pain.  Bowels moving.  S/p colonoscopy 07/2017.  Discussed decreased caffeine.    Past Medical History:  Diagnosis Date  . Carpal tunnel syndrome    bilateral  . Hypertension   . Obesity   . Sciatica of left side 02/28/2014   Past Surgical History:  Procedure Laterality Date  . arm surgery  1999   screws/plates  . CARPAL TUNNEL RELEASE  2008  . [place] and 2001  . cholescystectomy  1995  . COLONOSCOPY WITH PROPOFOL N/A 07/24/2017   Procedure: COLONOSCOPY WITH PROPOFOL;  Surgeon: [name], MD;  Location: [place];  Service: Endoscopy;  Laterality: N/A;  . POLYPECTOMY  07/24/2017   Procedure: POLYPECTOMY;  Surgeon: [name], MD;  Location: [place];  Service: Endoscopy;;  . TONSILLECTOMY  1973  . VAGINAL HYSTERECTOMY  2004   ovaries not removed, secondary to fibroids and bleeding   Family History  Problem Relation Age of Onset  . Prostate cancer Father   . Lymphoma Father   . Hypertension Mother   . Ulcerative colitis Mother   . Colon polyps Mother   . Kidney cancer Mother   . Heart disease Maternal Grandmother        myocardial infarction  . Colon cancer Neg Hx   . Breast cancer Neg Hx    Social History   Socioeconomic History  . Marital status: Married    Spouse name: Chioma Tay  . Number of children: 2  . Years of education: college  . Highest education level: Not on file  Occupational History  . Occupation:  Scientist, forensic: SELF EMPLOYED  Tobacco Use  . Smoking status: Never Smoker  . Smokeless tobacco: Never Used  Substance and Sexual Activity  . Alcohol use: No    Alcohol/week: 0.0 standard drinks  . Drug use: No  . Sexual activity: Yes    Birth control/protection: Surgical  Other Topics Concern  . Not on file  Social History Narrative  . Not on file   Social Determinants of Health   Financial Resource Strain:   . Difficulty of Paying Living Expenses:   Food Insecurity:   . Worried About Charity fundraiser in the Last Year:   . Arboriculturist in the Last Year:   Transportation Needs:   . Film/video editor (Medical):   Marland Kitchen Lack of Transportation (Non-Medical):   Physical Activity:   . Days of Exercise per Week:   . Minutes of Exercise per Session:   Stress:   . Feeling of Stress :   Social Connections:   . Frequency of Communication with Friends and Family:   . Frequency of Social Gatherings with Friends and Family:   . Attends Religious Services:   . Active Member of Clubs or Organizations:   . Attends Archivist Meetings:   Marland Kitchen Marital Status:     Outpatient  Encounter Medications as of 08/12/2019  Medication Sig  . albuterol (PROVENTIL HFA;VENTOLIN HFA) 108 (90 Base) MCG/ACT inhaler TAKE 2 PUFFS BY MOUTH EVERY 6 HOURS AS NEEDED FOR WHEEZE OR SHORTNESS OF BREATH  . ALPRAZolam (XANAX) 0.25 MG tablet Take 1 tablet (0.25 mg total) by mouth daily as needed for anxiety.  . fluticasone (FLONASE) 50 MCG/ACT nasal spray Place 2 sprays into both nostrils daily.  Marland Kitchen ibuprofen (ADVIL,MOTRIN) 200 MG tablet Take 200 mg by mouth every 6 (six) hours as needed.  . pantoprazole (PROTONIX) 40 MG tablet Take 1 tablet (40 mg total) by mouth 2 (two) times daily.  Marland Kitchen venlafaxine XR (EFFEXOR XR) 37.5 MG 24 hr capsule Take 1 capsule (37.5 mg total) by mouth daily with breakfast.  . [DISCONTINUED] ALPRAZolam (XANAX) 0.25 MG tablet Take by mouth.  . [DISCONTINUED]  benzonatate (TESSALON PERLES) 100 MG capsule Take 1-2 capsules (100-200 mg total) by mouth every 8 (eight) hours as needed for cough.  . [DISCONTINUED] Cetirizine HCl (ZYRTEC PO) Take 10 mg by mouth as needed.   . [DISCONTINUED] diclofenac (VOLTAREN) 75 MG EC tablet Take 1 tablet (75 mg total) by mouth 2 (two) times daily.  . [DISCONTINUED] doxycycline (VIBRA-TABS) 100 MG tablet Take 1 tablet (100 mg total) by mouth 2 (two) times daily.  . [DISCONTINUED] meloxicam (MOBIC) 15 MG tablet TAKE 1 TABLET BY MOUTH EVERY DAY**NEEDS OFFICE VISIT**  . [DISCONTINUED] NONFORMULARY OR COMPOUNDED ITEM See pharmacy note  . [DISCONTINUED] pantoprazole (PROTONIX) 40 MG tablet TAKE ONE TABLET BY MOUTH TWICE A DAY  . [DISCONTINUED] predniSONE (DELTASONE) 5 MG tablet Take 1 tablet (5 mg total) by mouth as directed. Taper taily 6,5,4,3,2,1  . [DISCONTINUED] triamcinolone cream (KENALOG) 0.1 % Apply 1 application topically 2 (two) times daily. (Patient not taking: Reported on 07/16/2017)   No facility-administered encounter medications on file as of 08/12/2019.   Review of Systems  Constitutional: Negative for appetite change and unexpected weight change.  HENT: Negative for congestion and sinus pressure.   Eyes: Negative for pain and visual disturbance.  Respiratory: Negative for cough, chest tightness and shortness of breath.   Cardiovascular: Negative for chest pain, palpitations and leg swelling.  Gastrointestinal: Negative for abdominal pain, diarrhea, nausea and vomiting.  Endocrine:       Hot flashes.    Genitourinary: Negative for difficulty urinating and dysuria.  Musculoskeletal: Negative for joint swelling and myalgias.  Skin: Negative for color change and rash.  Neurological: Negative for dizziness, light-headedness and headaches.  Hematological: Negative for adenopathy. Does not bruise/bleed easily.  Psychiatric/Behavioral: Positive for sleep disturbance. Negative for agitation and dysphoric mood.        Objective:    Physical Exam Constitutional:      General: She is not in acute distress.    Appearance: Normal appearance. She is well-developed.  HENT:     Head: Normocephalic and atraumatic.     Right Ear: External ear normal.     Left Ear: External ear normal.  Eyes:     General: No scleral icterus.       Right eye: No discharge.        Left eye: No discharge.     Conjunctiva/sclera: Conjunctivae normal.  Neck:     Thyroid: No thyromegaly.  Cardiovascular:     Rate and Rhythm: Normal rate and regular rhythm.  Pulmonary:     Effort: No tachypnea, accessory muscle usage or respiratory distress.     Breath sounds: Normal breath sounds. No decreased breath sounds  or wheezing.  Chest:     Breasts:        Right: No inverted nipple, mass, nipple discharge or tenderness (no axillary adenopathy).        Left: No inverted nipple, mass, nipple discharge or tenderness (no axilarry adenopathy).  Abdominal:     General: Bowel sounds are normal.     Palpations: Abdomen is soft.     Tenderness: There is no abdominal tenderness.  Genitourinary:    Comments: Normal external genitalia.  Vaginal vault without lesions.  Cervix identified.  Pap smear performed.  Could not appreciate any adnexal masses or tenderness.   Musculoskeletal:        General: No swelling or tenderness.     Cervical back: Neck supple. No tenderness.  Lymphadenopathy:     Cervical: No cervical adenopathy.  Skin:    Findings: No erythema or rash.  Neurological:     Mental Status: She is alert and oriented to person, place, and time.  Psychiatric:        Mood and Affect: Mood normal.        Behavior: Behavior normal.     BP 132/84   Pulse 73   Temp (!) 97.5 F (36.4 C)   Resp 16   Ht 5\' 8"  (1.727 m)   Wt (!) 311 lb 9.6 oz (141.3 kg)   SpO2 99%   BMI 47.38 kg/m  Wt Readings from Last 3 Encounters:  08/12/19 (!) 311 lb 9.6 oz (141.3 kg)  07/24/17 300 lb (136.1 kg)  01/29/17 289 lb 12.8 oz (131.5  kg)     Lab Results  Component Value Date   WBC 7.7 08/12/2019   HGB 13.5 08/12/2019   HCT 40.9 08/12/2019   PLT 306.0 08/12/2019   GLUCOSE 99 08/12/2019   CHOL 193 08/12/2019   TRIG 98.0 08/12/2019   HDL 49.70 08/12/2019   LDLDIRECT 147.8 04/23/2012   LDLCALC 124 (H) 08/12/2019   ALT 33 08/12/2019   AST 22 08/12/2019   NA 138 08/12/2019   K 3.7 08/12/2019   CL 101 08/12/2019   CREATININE 0.77 08/12/2019   BUN 18 08/12/2019   CO2 27 08/12/2019   TSH 2.05 08/12/2019    MM 3D SCREEN BREAST BILATERAL  Result Date: 05/30/2019 CLINICAL DATA:  Screening. EXAM: DIGITAL SCREENING BILATERAL MAMMOGRAM WITH TOMO AND CAD COMPARISON:  Previous exam(s). ACR Breast Density Category b: There are scattered areas of fibroglandular density. FINDINGS: There are no findings suspicious for malignancy. Images were processed with CAD. IMPRESSION: No mammographic evidence of malignancy. A result letter of this screening mammogram will be mailed directly to the patient. RECOMMENDATION: Screening mammogram in one year. (Code:SM-B-01Y) BI-RADS CATEGORY  1: Negative. Electronically Signed   By: Margarette Canada M.D.   On: 05/30/2019 16:30       Assessment & Plan:   Problem List Items Addressed This Visit    GERD (gastroesophageal reflux disease)    Some intermittent wheezing.  Treat acid reflux.  protonix bid.  Follow.        Relevant Medications   pantoprazole (PROTONIX) 40 MG tablet   Healthcare maintenance    Physical today 08/12/19.  Mammogram 05/30/19 - [facility].  Colonoscopy 07/2017.  Recommended f/u - per pt - 5 years.  PAP 08/12/19.        Hot flashes    Discussed hot flashes.  Discussed treatment.  Affecting her sleep.  Start effexor 37.5mg  q day.  Follow.  Hypercholesterolemia    Low cholesterol diet and exercise.  Follow lipid panel.        Relevant Orders   CBC with Differential/Platelet (Completed)   Comprehensive metabolic panel (Completed)   TSH (Completed)   Lipid panel  (Completed)    Other Visit Diagnoses    Routine general medical examination at a health care facility    -  Primary   Cervical cancer screening       Relevant Orders   Cytology - PAP( [facility]) (Completed)       Einar Pheasant, MD

## 2019-08-15 ENCOUNTER — Encounter: Payer: Self-pay | Admitting: Internal Medicine

## 2019-08-16 LAB — CYTOLOGY - PAP
Comment: NEGATIVE
Diagnosis: NEGATIVE
High risk HPV: NEGATIVE

## 2019-08-17 ENCOUNTER — Encounter: Payer: Self-pay | Admitting: Internal Medicine

## 2019-08-20 ENCOUNTER — Encounter: Payer: Self-pay | Admitting: Internal Medicine

## 2019-08-20 NOTE — Assessment & Plan Note (Signed)
Discussed hot flashes.  Discussed treatment.  Affecting her sleep.  Start effexor 37.5mg  q day.  Follow.

## 2019-08-20 NOTE — Assessment & Plan Note (Signed)
Low cholesterol diet and exercise.  Follow lipid panel.   

## 2019-08-20 NOTE — Assessment & Plan Note (Signed)
Some intermittent wheezing.  Treat acid reflux.  protonix bid.  Follow.

## 2019-09-29 ENCOUNTER — Encounter: Payer: Self-pay | Admitting: Internal Medicine

## 2019-10-07 ENCOUNTER — Ambulatory Visit: Payer: BC Managed Care – PPO | Admitting: Internal Medicine

## 2019-10-31 IMAGING — MG MM DIGITAL SCREENING BILAT W/ TOMO W/ CAD
8 series · 8 of 24 positions shown · non-contrast
Comparison: Previous exam(s).

CLINICAL DATA: Screening.

EXAM:
DIGITAL SCREENING BILATERAL MAMMOGRAM WITH TOMO AND CAD

[R MLO synth-2D]
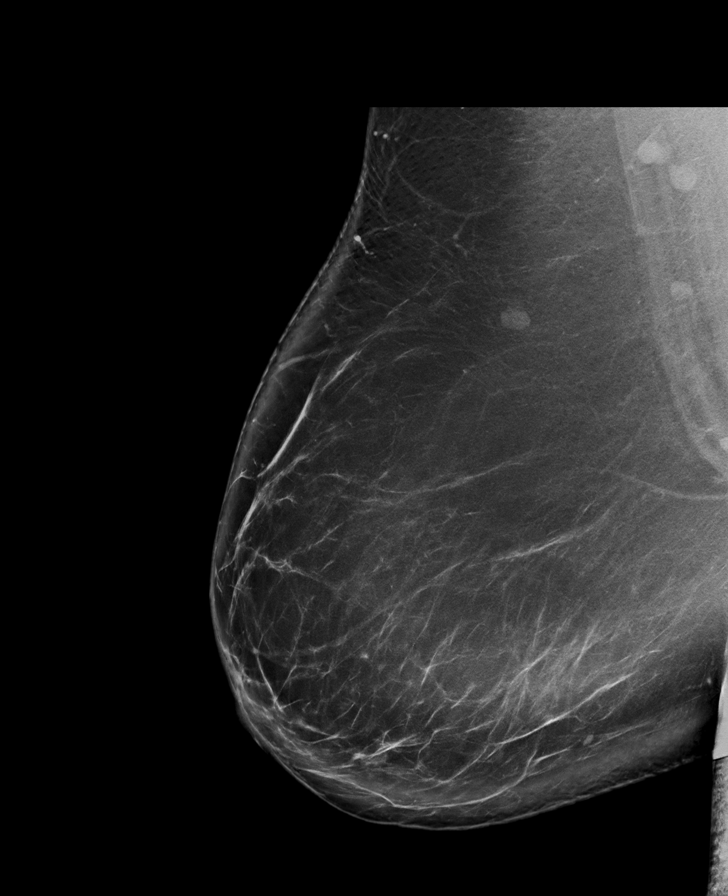

[L CC synth-2D]
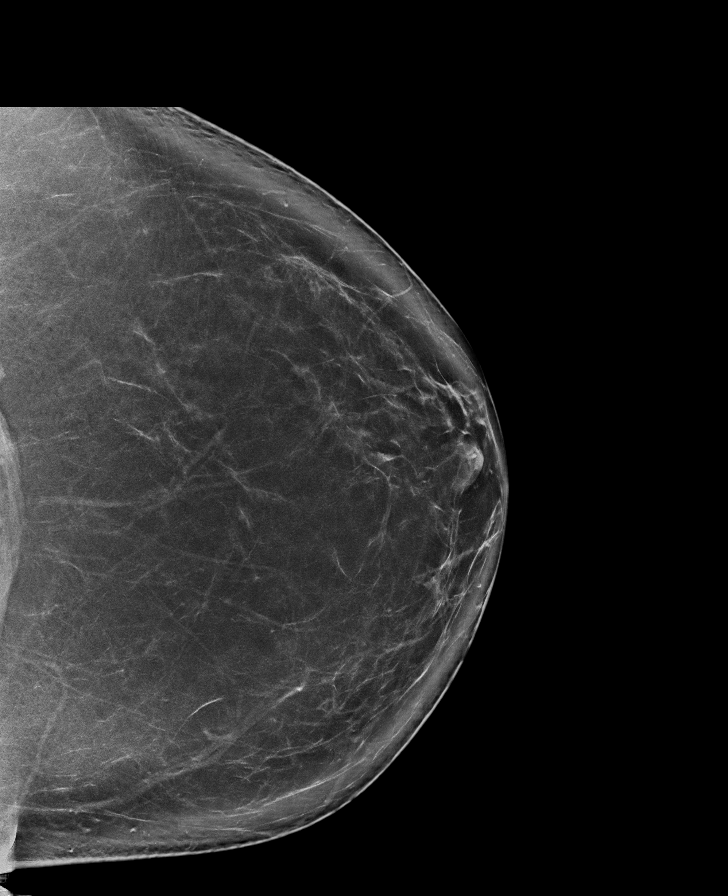

[R CC synth-2D]
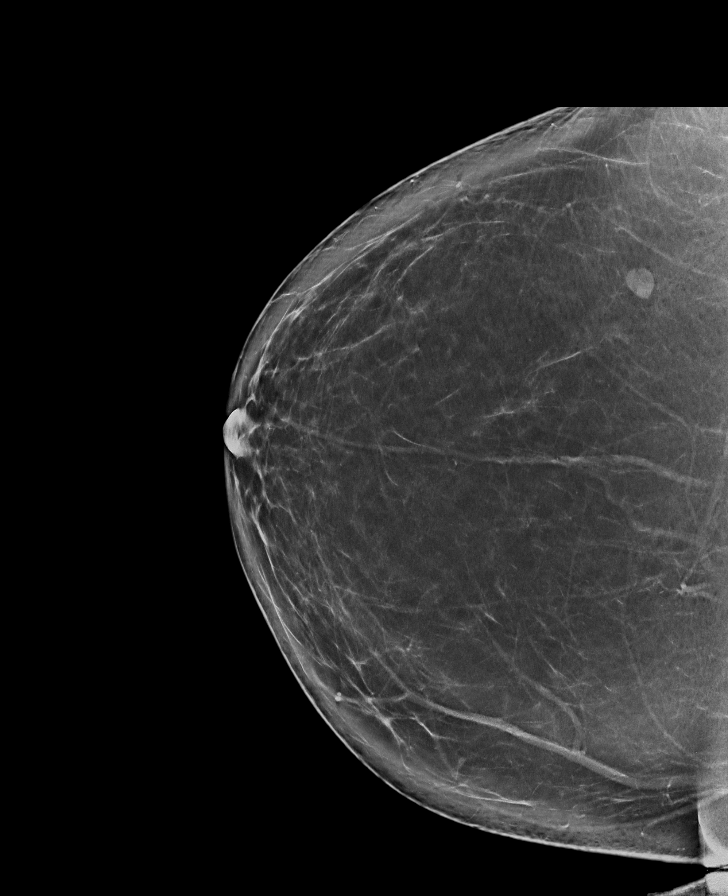

[L MLO synth-2D]
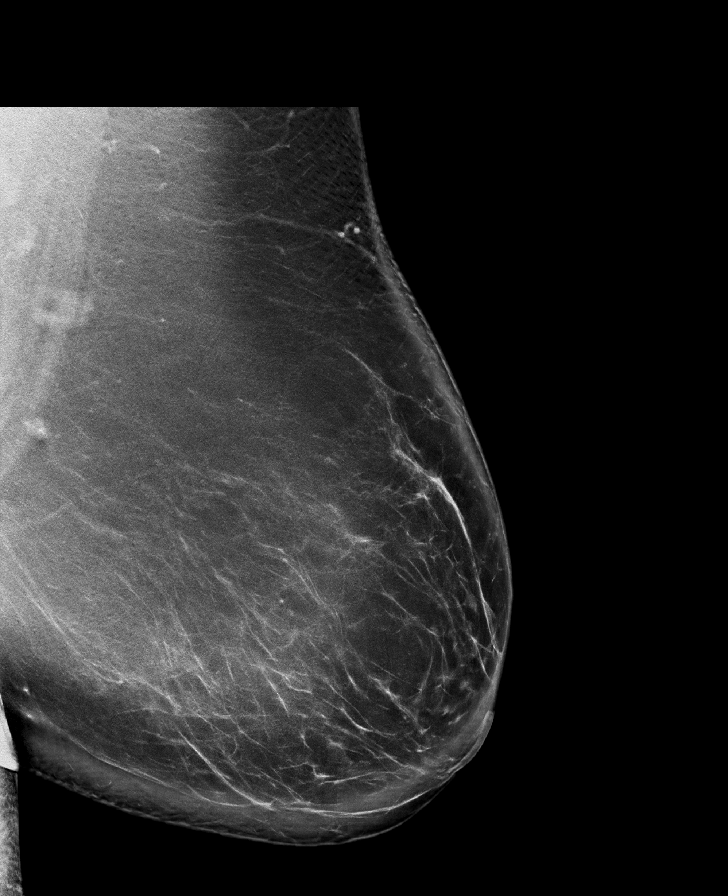

[L CC tomo · tomo slice 53/106.0]
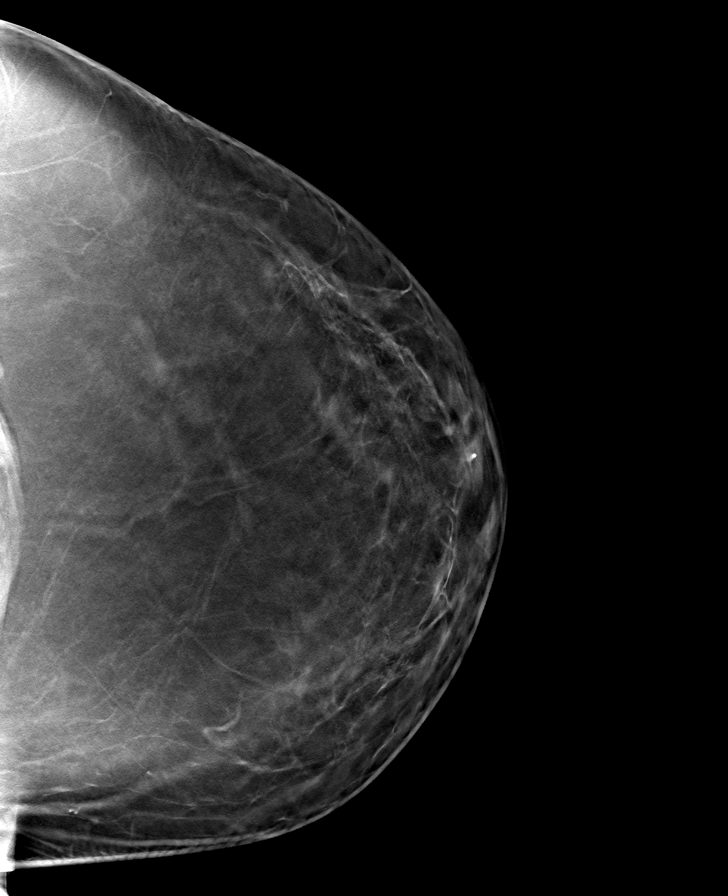

[R MLO tomo · tomo slice 62/123.0]
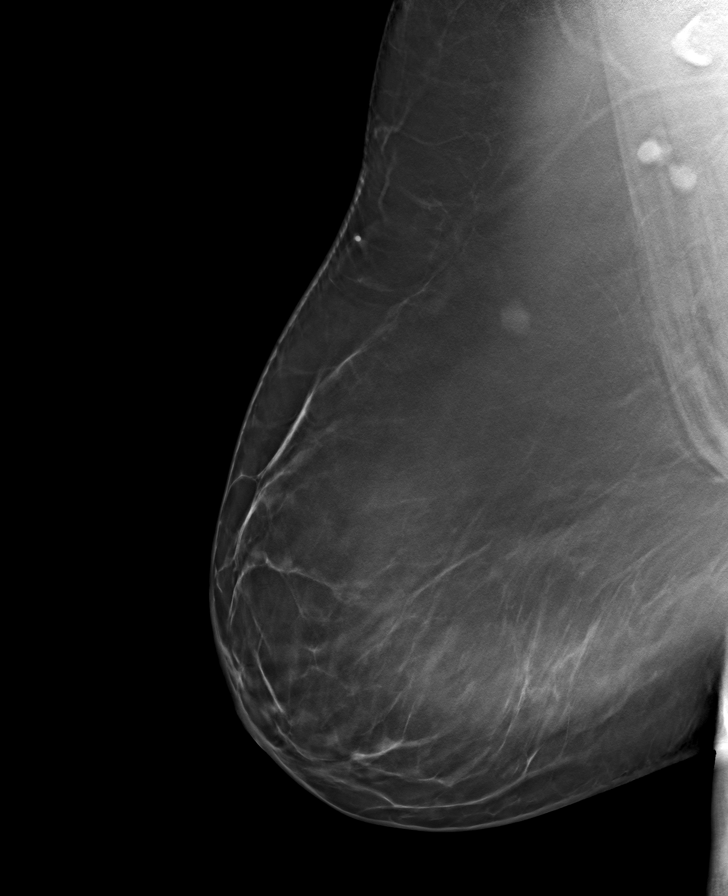

[R CC tomo · tomo slice 51/102.0]
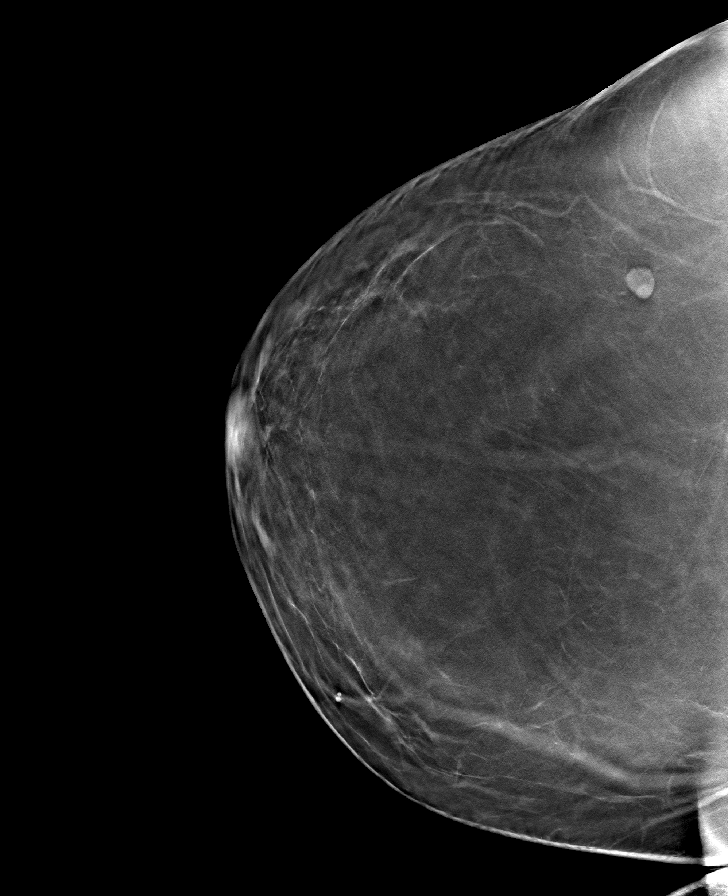

[L MLO tomo · tomo slice 65/130.0]
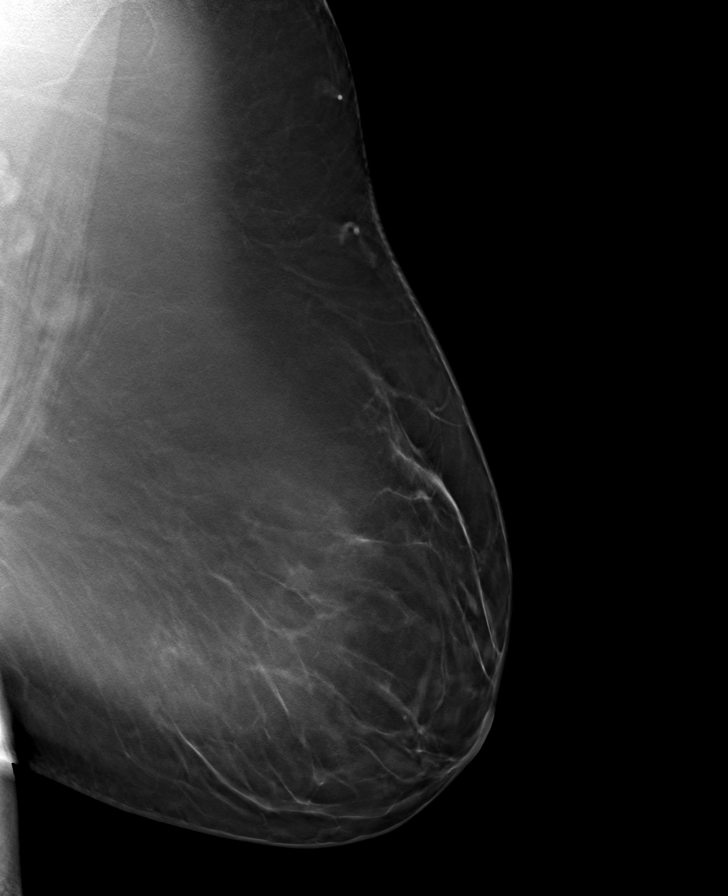

[8 of 24 positions shown; findings below may reference images not displayed]

ACR Breast Density Category b: There are scattered areas of
fibroglandular density.
FINDINGS: There are no findings suspicious for malignancy. Images were
processed with CAD.
IMPRESSION: No mammographic evidence of malignancy. A result letter of this
screening mammogram will be mailed directly to the patient.

RECOMMENDATION:
Screening mammogram in one year. (Code:CN-U-775)

BI-RADS CATEGORY  1: Negative.

## 2019-11-11 ENCOUNTER — Other Ambulatory Visit: Payer: Self-pay | Admitting: Internal Medicine

## 2019-11-14 ENCOUNTER — Telehealth: Payer: Self-pay | Admitting: Internal Medicine

## 2019-11-14 ENCOUNTER — Encounter: Payer: Self-pay | Admitting: Internal Medicine

## 2019-11-14 NOTE — Telephone Encounter (Signed)
Patient called stating she has been trying to resolve an issue with billing . The patient stated that she was billed for two visits. I reviewed the patient's chart for DOS 3.12.2021 I explained to the patient that she had a physical and some acute issues were discussed regarding her hot flashes and medication, intermittent wheezing and medication for treatment of her acid reflux. The patient stated that were trying to rip people off . However, I informed the patient that was not the case and the coders review what the provider has documented and they code and submit the findings to the insurance carrier. I asked if her insurance carrier change the patient stated that it has been [place]. Patient stated that her entire family comes her and they have never experienced being billed like that. I apologized to the patient. She asked for my name and title and hung up .

## 2019-11-23 ENCOUNTER — Telehealth: Payer: BC Managed Care – PPO | Admitting: Family

## 2019-11-23 DIAGNOSIS — M5441 Lumbago with sciatica, right side: Secondary | ICD-10-CM | POA: Diagnosis not present

## 2019-11-23 MED ORDER — PREDNISONE 10 MG (21) PO TBPK: ORAL_TABLET | ORAL | 0 refills | Status: DC

## 2019-11-23 MED ORDER — NAPROXEN 500 MG PO TABS: 500.0000 mg | ORAL_TABLET | Freq: Two times a day (BID) | ORAL | 0 refills | Status: DC

## 2019-11-23 MED ORDER — BACLOFEN 10 MG PO TABS: 10.0000 mg | ORAL_TABLET | Freq: Three times a day (TID) | ORAL | 0 refills | Status: DC

## 2019-11-23 NOTE — Progress Notes (Signed)

## 2019-11-29 ENCOUNTER — Other Ambulatory Visit: Payer: Self-pay | Admitting: Family

## 2019-11-29 DIAGNOSIS — M5441 Lumbago with sciatica, right side: Secondary | ICD-10-CM

## 2020-01-06 ENCOUNTER — Other Ambulatory Visit: Payer: Self-pay | Admitting: Internal Medicine

## 2020-03-05 ENCOUNTER — Encounter: Payer: Self-pay | Admitting: Internal Medicine

## 2020-03-06 ENCOUNTER — Other Ambulatory Visit: Payer: Self-pay | Admitting: Internal Medicine

## 2020-05-01 ENCOUNTER — Other Ambulatory Visit: Payer: Self-pay | Admitting: Internal Medicine

## 2020-05-17 ENCOUNTER — Other Ambulatory Visit: Payer: Self-pay | Admitting: Internal Medicine

## 2020-06-12 ENCOUNTER — Telehealth: Payer: BC Managed Care – PPO | Admitting: Nurse Practitioner

## 2020-06-12 DIAGNOSIS — M5441 Lumbago with sciatica, right side: Secondary | ICD-10-CM | POA: Diagnosis not present

## 2020-06-12 MED ORDER — PREDNISONE 10 MG (21) PO TBPK: ORAL_TABLET | ORAL | 0 refills | Status: DC

## 2020-06-12 NOTE — Progress Notes (Signed)
We are sorry that you are not feeling well.  Here is how we plan to help!  Based on what you have shared with me it looks like you mostly have acute back pain.  Acute back pain is defined as musculoskeletal pain that can resolve in 1-3 weeks with conservative treatment.  I have prescribed  Prednisone dose pack to take for 6 days. ( should not take NSAIDS while on steroid) Directions for 6 day taper: Day 1: 2 tablets before breakfast, 1 after both lunch & dinner and 2 at bedtime Day 2: 1 tab before breakfast, 1 after both lunch & dinner and 2 at bedtime Day 3: 1 tab at each meal & 1 at bedtime Day 4: 1 tab at breakfast, 1 at lunch, 1 at bedtime Day 5: 1 tab at breakfast & 1 tab at bedtime Day 6: 1 tab at breakfast    Some patients experience stomach irritation or in increased heartburn with anti-inflammatory drugs.  Please keep in mind that muscle relaxer's can cause fatigue and should not be taken while at work or driving.  Back pain is very common.  The pain often gets better over time.  The cause of back pain is usually not dangerous.  Most people can learn to manage their back pain on their own.  Home Care  Stay active.  Start with short walks on flat ground if you can.  Try to walk farther each day.  Do not sit, drive or stand in one place for more than 30 minutes.  Do not stay in bed.  Do not avoid exercise or work.  Activity can help your back heal faster.  Be careful when you bend or lift an object.  Bend at your knees, keep the object close to you, and do not twist.  Sleep on a firm mattress.  Lie on your side, and bend your knees.  If you lie on your back, put a pillow under your knees.  Only take medicines as told by your doctor.  Put ice on the injured area.  Put ice in a plastic bag  Place a towel between your skin and the bag  Leave the ice on for 15-20 minutes, 3-4 times a day for the first 2-3 days. 210 After that, you can switch between ice and heat packs.  Ask  your doctor about back exercises or massage.  Avoid feeling anxious or stressed.  Find good ways to deal with stress, such as exercise.  Get Help Right Way If:  Your pain does not go away with rest or medicine.  Your pain does not go away in 1 week.  You have new problems.  You do not feel well.  The pain spreads into your legs.  You cannot control when you poop (bowel movement) or pee (urinate)  You feel sick to your stomach (nauseous) or throw up (vomit)  You have belly (abdominal) pain.  You feel like you may pass out (faint).  If you develop a fever.  Make Sure you:  Understand these instructions.  Will watch your condition  Will get help right away if you are not doing well or get worse.  Your e-visit answers were reviewed by a board certified advanced clinical practitioner to complete your personal care plan.  Depending on the condition, your plan could have included both over the counter or prescription medications.  If there is a problem please reply  once you have received a response from your provider.  Your safety  is important to [place].  If you have drug allergies check your prescription carefully.    You can use MyChart to ask questions about today's visit, request a non-urgent call back, or ask for a work or school excuse for 24 hours related to this e-Visit. If it has been greater than 24 hours you will need to follow up with your provider, or enter a new e-Visit to address those concerns.  You will get an e-mail in the next two days asking about your experience.  I hope that your e-visit has been valuable and will speed your recovery. Thank you for using e-visits.  5-10 minutes spent reviewing and documenting in chart.

## 2020-07-02 ENCOUNTER — Encounter: Payer: Self-pay | Admitting: *Deleted

## 2020-07-03 ENCOUNTER — Other Ambulatory Visit: Payer: Self-pay | Admitting: Internal Medicine

## 2020-07-03 DIAGNOSIS — Z1231 Encounter for screening mammogram for malignant neoplasm of breast: Secondary | ICD-10-CM

## 2020-07-08 ENCOUNTER — Other Ambulatory Visit: Payer: Self-pay | Admitting: Internal Medicine

## 2020-08-03 ENCOUNTER — Other Ambulatory Visit: Payer: Self-pay

## 2020-08-03 ENCOUNTER — Ambulatory Visit
Admission: RE | Admit: 2020-08-03 | Discharge: 2020-08-03 | Disposition: A | Discharge status: Home / Self Care | Payer: BC Managed Care – PPO | Source: Ambulatory Visit | Attending: Internal Medicine | Admitting: Internal Medicine

## 2020-08-03 DIAGNOSIS — Z1231 Encounter for screening mammogram for malignant neoplasm of breast: Secondary | ICD-10-CM | POA: Diagnosis not present

## 2020-08-06 ENCOUNTER — Telehealth: Payer: Self-pay | Admitting: Internal Medicine

## 2020-08-06 NOTE — Telephone Encounter (Signed)
Needs physical scheduled.  Thanks.   

## 2020-08-07 NOTE — Telephone Encounter (Signed)
Called and schedule  Pt wanted to wait til the end of May

## 2020-08-13 ENCOUNTER — Encounter: Payer: BC Managed Care – PPO | Admitting: Internal Medicine

## 2020-09-05 ENCOUNTER — Telehealth: Payer: Self-pay | Admitting: Internal Medicine

## 2020-09-05 NOTE — Telephone Encounter (Signed)
Noted  

## 2020-09-05 NOTE — Telephone Encounter (Signed)
Appt came available called pt back and scheduled for 09/06/20

## 2020-09-05 NOTE — Telephone Encounter (Signed)
Pt called she is having knee swelling and wants to be worked in with Dr. Nicki Reaper this week  She wouldn't give any other info  Pt stated this is why "she doesn't  come her because she can't get an appt when she needs one"

## 2020-09-06 ENCOUNTER — Other Ambulatory Visit: Payer: Self-pay | Admitting: Internal Medicine

## 2020-09-06 ENCOUNTER — Encounter: Payer: Self-pay | Admitting: Internal Medicine

## 2020-09-06 ENCOUNTER — Other Ambulatory Visit: Payer: Self-pay

## 2020-09-06 ENCOUNTER — Ambulatory Visit
Admission: RE | Admit: 2020-09-06 | Discharge: 2020-09-06 | Disposition: A | Discharge status: Home / Self Care | Payer: BC Managed Care – PPO | Source: Ambulatory Visit | Attending: Internal Medicine | Admitting: Internal Medicine

## 2020-09-06 ENCOUNTER — Ambulatory Visit (INDEPENDENT_AMBULATORY_CARE_PROVIDER_SITE_OTHER): Payer: BC Managed Care – PPO | Admitting: Internal Medicine

## 2020-09-06 DIAGNOSIS — M7989 Other specified soft tissue disorders: Secondary | ICD-10-CM

## 2020-09-06 DIAGNOSIS — E7800 Pure hypercholesterolemia, unspecified: Secondary | ICD-10-CM | POA: Diagnosis not present

## 2020-09-06 DIAGNOSIS — I10 Essential (primary) hypertension: Secondary | ICD-10-CM

## 2020-09-06 DIAGNOSIS — L03116 Cellulitis of left lower limb: Secondary | ICD-10-CM

## 2020-09-06 DIAGNOSIS — L0390 Cellulitis, unspecified: Secondary | ICD-10-CM | POA: Insufficient documentation

## 2020-09-06 MED ORDER — DOXYCYCLINE HYCLATE 100 MG PO TABS: 100.0000 mg | ORAL_TABLET | Freq: Two times a day (BID) | ORAL | 0 refills | Status: DC

## 2020-09-06 NOTE — Progress Notes (Signed)
Order placed for [facility] referral.

## 2020-09-06 NOTE — Assessment & Plan Note (Signed)
Some venostasis changes as well, but concern regarding overlying cellulitis.  Treat with doxycycline.  Probiotic as directed.  Follow.

## 2020-09-06 NOTE — Assessment & Plan Note (Signed)
Low cholesterol diet and exercise.  Follow lipid panel.   

## 2020-09-06 NOTE — Assessment & Plan Note (Signed)
Has a documented history of hypertension.  Blood pressure better at home.  Have her spot check her pressure.  She has a scheduled f/u with me soon.  Reassess. May need to add medication.

## 2020-09-06 NOTE — Addendum Note (Signed)
Addended by: Alisa Graff on: 09/06/2020 10:49 AM   Modules accepted: Orders

## 2020-09-06 NOTE — Assessment & Plan Note (Addendum)
Left leg- increased swelling, redness and calf tenderness.  Obtain left lower extremity ultrasound. Further w/up and evaluation pending results.  Also has increased soft tissue fullness popliteal - left.  Given history of arthritis and palpable fullness, will evaluate as well for Baker's cyst.

## 2020-09-06 NOTE — Progress Notes (Addendum)
Patient ID: Jenny Stephenson, female   DOB: 11-08-1965, 55 y.o.   MRN: [id number]   Subjective:    Patient ID: Jenny Stephenson, female    DOB: 04-08-1966, 58 y.o.   MRN: [id number]  HPI This visit occurred during the SARS-CoV-2 public health emergency.  Safety protocols were in place, including screening questions prior to the visit, additional usage of staff PPE, and extensive cleaning of exam room while observing appropriate contact time as indicated for disinfecting solutions.  Patient here for work in appt.  Work in with concerns regarding left leg redness and swelling.  Her left leg is normally larger than her right.  Has noticed some increased pain recently - left knee - medial aspect.  More swollen this week.  Redness is not new, but is worse as day progresses.  She fell in 03/2020.  Saw ortho.  Told had arthritis kne..  No chest pain or sob reported.  States blood pressures normally averaging 130s/78-90.  Off effexor.  Has a physical scheduled with me next month.   Past Medical History:  Diagnosis Date  . Carpal tunnel syndrome    bilateral  . Hypertension   . Obesity   . Sciatica of left side 02/28/2014   Past Surgical History:  Procedure Laterality Date  . arm surgery  1999   screws/plates  . CARPAL TUNNEL RELEASE  2008  . [place] and 2001  . cholescystectomy  1995  . COLONOSCOPY WITH PROPOFOL N/A 07/24/2017   Procedure: COLONOSCOPY WITH PROPOFOL;  Surgeon: [name], MD;  Location: [facility];  Service: Endoscopy;  Laterality: N/A;  . POLYPECTOMY  07/24/2017   Procedure: POLYPECTOMY;  Surgeon: [name], MD;  Location: [place];  Service: Endoscopy;;  . TONSILLECTOMY  1973  . VAGINAL HYSTERECTOMY  2004   ovaries not removed, secondary to fibroids and bleeding   Family History  Problem Relation Age of Onset  . Prostate cancer Father   . Lymphoma Father   . Hypertension Mother   . Ulcerative colitis Mother   . Colon polyps  Mother   . Kidney cancer Mother   . Heart disease Maternal Grandmother        myocardial infarction  . Colon cancer Neg Hx   . Breast cancer Neg Hx    Social History   Socioeconomic History  . Marital status: Married    Spouse name: Carlesha Seiple  . Number of children: 2  . Years of education: college  . Highest education level: Not on file  Occupational History  . Occupation: Scientist, forensic: SELF EMPLOYED  Tobacco Use  . Smoking status: Never Smoker  . Smokeless tobacco: Never Used  Substance and Sexual Activity  . Alcohol use: No    Alcohol/week: 0.0 standard drinks  . Drug use: No  . Sexual activity: Yes    Birth control/protection: Surgical  Other Topics Concern  . Not on file  Social History Narrative  . Not on file   Social Determinants of Health   Financial Resource Strain: Not on file  Food Insecurity: Not on file  Transportation Needs: Not on file  Physical Activity: Not on file  Stress: Not on file  Social Connections: Not on file    Outpatient Encounter Medications as of 09/06/2020  Medication Sig  . doxycycline (VIBRA-TABS) 100 MG tablet Take 1 tablet (100 mg total) by mouth 2 (two) times daily.  Marland Kitchen ALPRAZolam (XANAX) 0.25 MG tablet Take 1 tablet (  0.25 mg total) by mouth daily as needed for anxiety.  . fexofenadine (ALLEGRA ALLERGY) 180 MG tablet Take 1 tablet by mouth daily.  . fluticasone (FLONASE) 50 MCG/ACT nasal spray Place 2 sprays into both nostrils daily.  Marland Kitchen ibuprofen (ADVIL,MOTRIN) 200 MG tablet Take 200 mg by mouth every 6 (six) hours as needed.  . pantoprazole (PROTONIX) 40 MG tablet TAKE ONE TABLET BY MOUTH TWICE A DAY  . [DISCONTINUED] baclofen (LIORESAL) 10 MG tablet Take 1 tablet (10 mg total) by mouth 3 (three) times daily.  . [DISCONTINUED] naproxen (NAPROSYN) 500 MG tablet Take 1 tablet (500 mg total) by mouth 2 (two) times daily with a meal.  . [DISCONTINUED] predniSONE (STERAPRED UNI-PAK 21 TAB) 10 MG (21) TBPK  tablet As directed x 6 days  . [DISCONTINUED] venlafaxine XR (EFFEXOR-XR) 37.5 MG 24 hr capsule TAKE ONE CAPSULE BY MOUTH DAILY WITH BREAKFAST   No facility-administered encounter medications on file as of 09/06/2020.    Review of Systems  Constitutional: Negative for fever.       Has gained some weight.   HENT: Negative for congestion and sinus pressure.   Respiratory: Negative for cough, chest tightness and shortness of breath.   Cardiovascular: Negative for chest pain and palpitations.  Gastrointestinal: Negative for abdominal pain, diarrhea, nausea and vomiting.  Genitourinary: Negative for difficulty urinating and dysuria.  Musculoskeletal: Negative for myalgias.       Left knee pain   Skin: Negative for rash and wound.       Redness - left lower extremity.   Neurological: Negative for dizziness, light-headedness and headaches.  Psychiatric/Behavioral: Negative for agitation and dysphoric mood.       Objective:    Physical Exam Vitals reviewed.  Constitutional:      General: She is not in acute distress.    Appearance: Normal appearance.  HENT:     Head: Normocephalic and atraumatic.     Right Ear: External ear normal.     Left Ear: External ear normal.  Eyes:     General: No scleral icterus.       Right eye: No discharge.        Left eye: No discharge.     Conjunctiva/sclera: Conjunctivae normal.  Neck:     Thyroid: No thyromegaly.  Cardiovascular:     Rate and Rhythm: Normal rate and regular rhythm.  Pulmonary:     Effort: No respiratory distress.     Breath sounds: Normal breath sounds. No wheezing.  Abdominal:     General: Bowel sounds are normal.     Palpations: Abdomen is soft.     Tenderness: There is no abdominal tenderness.  Musculoskeletal:     Cervical back: Neck supple. No tenderness.     Comments: Left lower leg - larger than right.  Some increased left calf tenderness.  No pain with flexion and extension - knee.    Lymphadenopathy:     Cervical:  No cervical adenopathy.  Skin:    Findings: No rash.     Comments: Lower extremity - erythema.  Some increased warmth.   Neurological:     Mental Status: She is alert.  Psychiatric:        Mood and Affect: Mood normal.        Behavior: Behavior normal.     BP (!) 144/84   Pulse 72   Temp 97.9 F (36.6 C) (Oral)   Resp 16   Ht 5\' 8"  (1.727 m)   Wt (!) 317  lb (143.8 kg)   SpO2 98%   BMI 48.20 kg/m  Wt Readings from Last 3 Encounters:  09/06/20 (!) 317 lb (143.8 kg)  08/12/19 (!) 311 lb 9.6 oz (141.3 kg)  07/24/17 300 lb (136.1 kg)     Lab Results  Component Value Date   WBC 7.7 08/12/2019   HGB 13.5 08/12/2019   HCT 40.9 08/12/2019   PLT 306.0 08/12/2019   GLUCOSE 99 08/12/2019   CHOL 193 08/12/2019   TRIG 98.0 08/12/2019   HDL 49.70 08/12/2019   LDLDIRECT 147.8 04/23/2012   LDLCALC 124 (H) 08/12/2019   ALT 33 08/12/2019   AST 22 08/12/2019   NA 138 08/12/2019   K 3.7 08/12/2019   CL 101 08/12/2019   CREATININE 0.77 08/12/2019   BUN 18 08/12/2019   CO2 27 08/12/2019   TSH 2.05 08/12/2019    MM 3D SCREEN BREAST BILATERAL  Result Date: 08/06/2020 CLINICAL DATA:  Screening. EXAM: DIGITAL SCREENING BILATERAL MAMMOGRAM WITH TOMOSYNTHESIS AND CAD TECHNIQUE: Bilateral screening digital craniocaudal and mediolateral oblique mammograms were obtained. Bilateral screening digital breast tomosynthesis was performed. The images were evaluated with computer-aided detection. COMPARISON:  Previous exam(s). ACR Breast Density Category a: The breast tissue is almost entirely fatty. FINDINGS: There are no findings suspicious for malignancy. The images were evaluated with computer-aided detection. IMPRESSION: No mammographic evidence of malignancy. A result letter of this screening mammogram will be mailed directly to the patient. RECOMMENDATION: Screening mammogram in one year. (Code:SM-B-01Y) BI-RADS CATEGORY  1: Negative. Electronically Signed   By: Franki Cabot M.D.   On:  08/06/2020 15:50       Assessment & Plan:   Problem List Items Addressed This Visit    Cellulitis    Some venostasis changes as well, but concern regarding overlying cellulitis.  Treat with doxycycline.  Probiotic as directed.  Follow.       Hypercholesterolemia    Low cholesterol diet and exercise.  Follow lipid panel.        Hypertension    Has a documented history of hypertension.  Blood pressure better at home.  Have her spot check her pressure.  She has a scheduled f/u with me soon.  Reassess. May need to add medication.       Left leg swelling    Left leg- increased swelling, redness and calf tenderness.  Obtain left lower extremity ultrasound. Further w/up and evaluation pending results.  Also has increased soft tissue fullness popliteal - left.  Given history of arthritis and palpable fullness, will evaluate as well for Baker's cyst.       Relevant Orders   US Venous Img Lower Unilateral Left (Completed)       Einar Pheasant, MD

## 2020-09-07 ENCOUNTER — Telehealth: Payer: Self-pay

## 2020-09-07 NOTE — Telephone Encounter (Signed)
Ok, Thank you! 

## 2020-09-07 NOTE — Telephone Encounter (Signed)
Pt called and wanted to tell you that she wants to go to [facility] on [address] in [place] instead of [facility]. Please advise

## 2020-09-28 ENCOUNTER — Telehealth: Payer: BC Managed Care – PPO | Admitting: Physician Assistant

## 2020-09-28 DIAGNOSIS — J40 Bronchitis, not specified as acute or chronic: Secondary | ICD-10-CM

## 2020-09-28 DIAGNOSIS — J019 Acute sinusitis, unspecified: Secondary | ICD-10-CM | POA: Diagnosis not present

## 2020-09-28 DIAGNOSIS — B9689 Other specified bacterial agents as the cause of diseases classified elsewhere: Secondary | ICD-10-CM | POA: Diagnosis not present

## 2020-09-28 MED ORDER — AMOXICILLIN-POT CLAVULANATE 875-125 MG PO TABS: 1.0000 | ORAL_TABLET | Freq: Two times a day (BID) | ORAL | 0 refills | Status: DC

## 2020-09-28 MED ORDER — PREDNISONE 10 MG (21) PO TBPK: ORAL_TABLET | ORAL | 0 refills | Status: DC

## 2020-09-28 NOTE — Progress Notes (Signed)
We are sorry that you are not feeling well.  Here is how we plan to help!  Based on what you have shared with me it looks like you have sinusitis.  Sinusitis is inflammation and infection in the sinus cavities of the head.  Based on your presentation I believe you most likely have Acute Bacterial Sinusitis.  This is an infection caused by bacteria and is treated with antibiotics. I have prescribed Augmentin 875mg /125mg  one tablet twice daily with food, for 7 days. and Prednisone 6 day taper. You may use an oral decongestant such as Mucinex D or if you have glaucoma or high blood pressure use plain Mucinex. Saline nasal spray help and can safely be used as often as needed for congestion.  If you develop worsening sinus pain, fever or notice severe headache and vision changes, or if symptoms are not better after completion of antibiotic, please schedule an appointment with a health care provider.    Sinus infections are not as easily transmitted as other respiratory infection, however we still recommend that you avoid close contact with loved ones, especially the very young and elderly.  Remember to wash your hands thoroughly throughout the day as this is the number one way to prevent the spread of infection!  Home Care:  Only take medications as instructed by your medical team.  Complete the entire course of an antibiotic.  Do not take these medications with alcohol.  A steam or ultrasonic humidifier can help congestion.  You can place a towel over your head and breathe in the steam from hot water coming from a faucet.  Avoid close contacts especially the very young and the elderly.  Cover your mouth when you cough or sneeze.  Always remember to wash your hands.  Get Help Right Away If:  You develop worsening fever or sinus pain.  You develop a severe head ache or visual changes.  Your symptoms persist after you have completed your treatment plan.  Make sure you  Understand these  instructions.  Will watch your condition.  Will get help right away if you are not doing well or get worse.  Your e-visit answers were reviewed by a board certified advanced clinical practitioner to complete your personal care plan.  Depending on the condition, your plan could have included both over the counter or prescription medications.  If there is a problem please reply  once you have received a response from your provider.  Your safety is important to [place].  If you have drug allergies check your prescription carefully.    You can use MyChart to ask questions about today's visit, request a non-urgent call back, or ask for a work or school excuse for 24 hours related to this e-Visit. If it has been greater than 24 hours you will need to follow up with your provider, or enter a new e-Visit to address those concerns.  You will get an e-mail in the next two days asking about your experience.  I hope that your e-visit has been valuable and will speed your recovery. Thank you for using e-visits.  I provided 7 minutes of non face-to-face time during this encounter for chart review and documentation.

## 2020-10-26 ENCOUNTER — Encounter: Payer: Self-pay | Admitting: Internal Medicine

## 2020-10-26 ENCOUNTER — Other Ambulatory Visit: Payer: Self-pay

## 2020-10-26 ENCOUNTER — Ambulatory Visit (INDEPENDENT_AMBULATORY_CARE_PROVIDER_SITE_OTHER): Payer: BC Managed Care – PPO | Admitting: Internal Medicine

## 2020-10-26 VITALS — BP 124/76 | HR 77 | Temp 96.7°F | Resp 16 | Ht 68.0 in | Wt 315.0 lb

## 2020-10-26 DIAGNOSIS — I10 Essential (primary) hypertension: Secondary | ICD-10-CM | POA: Diagnosis not present

## 2020-10-26 DIAGNOSIS — F419 Anxiety disorder, unspecified: Secondary | ICD-10-CM

## 2020-10-26 DIAGNOSIS — K219 Gastro-esophageal reflux disease without esophagitis: Secondary | ICD-10-CM

## 2020-10-26 DIAGNOSIS — E7800 Pure hypercholesterolemia, unspecified: Secondary | ICD-10-CM

## 2020-10-26 DIAGNOSIS — R232 Flushing: Secondary | ICD-10-CM

## 2020-10-26 DIAGNOSIS — Z9109 Other allergy status, other than to drugs and biological substances: Secondary | ICD-10-CM

## 2020-10-26 DIAGNOSIS — Z0000 Encounter for general adult medical examination without abnormal findings: Secondary | ICD-10-CM

## 2020-10-26 LAB — CBC WITH DIFFERENTIAL/PLATELET
Basophils Absolute: 0.1 10*3/uL (ref 0.0–0.1)
Basophils Relative: 0.9 % (ref 0.0–3.0)
Eosinophils Absolute: 0.5 10*3/uL (ref 0.0–0.7)
Eosinophils Relative: 5.6 % — ABNORMAL HIGH (ref 0.0–5.0)
HCT: 41 % (ref 36.0–46.0)
Hemoglobin: 13.8 g/dL (ref 12.0–15.0)
Lymphocytes Relative: 28.2 % (ref 12.0–46.0)
Lymphs Abs: 2.5 10*3/uL (ref 0.7–4.0)
MCHC: 33.6 g/dL (ref 30.0–36.0)
MCV: 82 fl (ref 78.0–100.0)
Monocytes Absolute: 0.5 10*3/uL (ref 0.1–1.0)
Monocytes Relative: 6.1 % (ref 3.0–12.0)
Neutro Abs: 5.2 10*3/uL (ref 1.4–7.7)
Neutrophils Relative %: 59.2 % (ref 43.0–77.0)
Platelets: 313 10*3/uL (ref 150.0–400.0)
RBC: 5 Mil/uL (ref 3.87–5.11)
RDW: 14.8 % (ref 11.5–15.5)
WBC: 8.7 10*3/uL (ref 4.0–10.5)

## 2020-10-26 LAB — LIPID PANEL
Cholesterol: 224 mg/dL — ABNORMAL HIGH (ref 0–200)
HDL: 55.9 mg/dL (ref >39.00)
LDL Cholesterol: 148 mg/dL — ABNORMAL HIGH (ref 0–99)
NonHDL: 168.14
Total CHOL/HDL Ratio: 4
Triglycerides: 99 mg/dL (ref 0.0–149.0)
VLDL: 19.8 mg/dL (ref 0.0–40.0)

## 2020-10-26 LAB — TSH: TSH: 2.94 u[IU]/mL (ref 0.35–4.50)

## 2020-10-26 LAB — COMPREHENSIVE METABOLIC PANEL
ALT: 32 U/L (ref 0–35)
AST: 19 U/L (ref 0–37)
Albumin: 4.5 g/dL (ref 3.5–5.2)
Alkaline Phosphatase: 98 U/L (ref 39–117)
BUN: 23 mg/dL (ref 6–23)
CO2: 27 mEq/L (ref 19–32)
Calcium: 9.8 mg/dL (ref 8.4–10.5)
Chloride: 98 mEq/L (ref 96–112)
Creatinine, Ser: 0.76 mg/dL (ref 0.40–1.20)
GFR: 88.92 mL/min (ref >60.00)
Glucose, Bld: 107 mg/dL — ABNORMAL HIGH (ref 70–99)
Potassium: 3.9 mEq/L (ref 3.5–5.1)
Sodium: 137 mEq/L (ref 135–145)
Total Bilirubin: 0.7 mg/dL (ref 0.2–1.2)
Total Protein: 6.8 g/dL (ref 6.0–8.3)

## 2020-10-26 MED ORDER — VENLAFAXINE HCL ER 37.5 MG PO CP24: 37.5000 mg | ORAL_CAPSULE | Freq: Every day | ORAL | 2 refills | Status: DC

## 2020-10-26 MED ORDER — FLUTICASONE PROPIONATE 50 MCG/ACT NA SUSP: 2.0000 | Freq: Every day | NASAL | 6 refills | Status: DC

## 2020-10-26 MED ORDER — LEVOCETIRIZINE DIHYDROCHLORIDE 5 MG PO TABS: 5.0000 mg | ORAL_TABLET | Freq: Every day | ORAL | 2 refills | Status: DC

## 2020-10-26 NOTE — Assessment & Plan Note (Addendum)
Physical today 10/26/20. PAP 08/12/19 - negative with negative HPV.  S/p hysterectomy.   Mammogram 08/06/20 - Birads I.  Colonoscopy 07/2017 - recommended f/u in 10 years.

## 2020-10-26 NOTE — Patient Instructions (Signed)
Stop allegra  Start xyzal - one per day.  Start effexor 37.5mg  one per day.  Monitor your blood pressure.  Call with update.

## 2020-10-26 NOTE — Progress Notes (Signed)
Patient ID: Nataly Pacifico, female   DOB: 24-May-1966, 55 y.o.   MRN: [id number]   Subjective:    Patient ID: Sheppard Coil, female    DOB: 1965-12-28, 55 y.o.   MRN: [id number]  HPI This visit occurred during the SARS-CoV-2 public health emergency.  Safety protocols were in place, including screening questions prior to the visit, additional usage of staff PPE, and extensive cleaning of exam room while observing appropriate contact time as indicated for disinfecting solutions.  Patient here for her physical exam.  She reports she is doing relatively well.  Just returned from the beach.  Tries to stay active.  No chest pain or sob reported.  No abdominal pain or bowel change reported.  Treated for sinus infection 09/28/20.  Some allergy symptoms - intermittent.  Has been taking allegra and using flonase prn.  Discussed changing to xyzal and using flonase more regularly.  No chest congestion.  Was evaluated at [facility] - instructed compression hose.  Discussed blood pressure readings.  Reviewed outside checks.  Slight elevation on prednisone.  Recent checks:  120s/80s. Hot flashes.    Past Medical History:  Diagnosis Date  . Carpal tunnel syndrome    bilateral  . Hypertension   . Obesity   . Sciatica of left side 02/28/2014   Past Surgical History:  Procedure Laterality Date  . arm surgery  1999   screws/plates  . CARPAL TUNNEL RELEASE  2008  . [place] and 2001  . cholescystectomy  1995  . COLONOSCOPY WITH PROPOFOL N/A 07/24/2017   Procedure: COLONOSCOPY WITH PROPOFOL;  Surgeon: [name], MD;  Location: [place];  Service: Endoscopy;  Laterality: N/A;  . POLYPECTOMY  07/24/2017   Procedure: POLYPECTOMY;  Surgeon: [name], MD;  Location: [place];  Service: Endoscopy;;  . TONSILLECTOMY  1973  . VAGINAL HYSTERECTOMY  2004   ovaries not removed, secondary to fibroids and bleeding   Family History  Problem Relation  Age of Onset  . Prostate cancer Father   . Lymphoma Father   . Hypertension Mother   . Ulcerative colitis Mother   . Colon polyps Mother   . Kidney cancer Mother   . Heart disease Maternal Grandmother        myocardial infarction  . Colon cancer Neg Hx   . Breast cancer Neg Hx    Social History   Socioeconomic History  . Marital status: Married    Spouse name: Lichelle Viets  . Number of children: 2  . Years of education: college  . Highest education level: Not on file  Occupational History  . Occupation: Scientist, forensic: SELF EMPLOYED  Tobacco Use  . Smoking status: Never Smoker  . Smokeless tobacco: Never Used  Substance and Sexual Activity  . Alcohol use: No    Alcohol/week: 0.0 standard drinks  . Drug use: No  . Sexual activity: Yes    Birth control/protection: Surgical  Other Topics Concern  . Not on file  Social History Narrative  . Not on file   Social Determinants of Health   Financial Resource Strain: Not on file  Food Insecurity: Not on file  Transportation Needs: Not on file  Physical Activity: Not on file  Stress: Not on file  Social Connections: Not on file    Outpatient Encounter Medications as of 10/26/2020  Medication Sig  . levocetirizine (XYZAL) 5 MG tablet Take 1 tablet (5 mg total) by mouth  daily.  . venlafaxine XR (EFFEXOR XR) 37.5 MG 24 hr capsule Take 1 capsule (37.5 mg total) by mouth daily with breakfast.  . ALPRAZolam (XANAX) 0.25 MG tablet Take 1 tablet (0.25 mg total) by mouth daily as needed for anxiety.  . fluticasone (FLONASE) 50 MCG/ACT nasal spray Place 2 sprays into both nostrils daily.  Marland Kitchen ibuprofen (ADVIL,MOTRIN) 200 MG tablet Take 200 mg by mouth every 6 (six) hours as needed.  . pantoprazole (PROTONIX) 40 MG tablet TAKE ONE TABLET BY MOUTH TWICE A DAY  . [DISCONTINUED] amoxicillin-clavulanate (AUGMENTIN) 875-125 MG tablet Take 1 tablet by mouth 2 (two) times daily.  . [DISCONTINUED] fexofenadine (ALLEGRA  ALLERGY) 180 MG tablet Take 1 tablet by mouth daily.  . [DISCONTINUED] fluticasone (FLONASE) 50 MCG/ACT nasal spray Place 2 sprays into both nostrils daily.  . [DISCONTINUED] predniSONE (STERAPRED UNI-PAK 21 TAB) 10 MG (21) TBPK tablet 6 day taper; take as directed on package instructions   No facility-administered encounter medications on file as of 10/26/2020.    Review of Systems  Constitutional: Negative for appetite change and unexpected weight change.  HENT: Negative for congestion, sinus pressure and sore throat.        Allergy symptoms as outlined.    Eyes: Negative for pain and visual disturbance.  Respiratory: Negative for cough, chest tightness and shortness of breath.   Cardiovascular: Negative for chest pain and palpitations.       No increased swelling.   Gastrointestinal: Negative for abdominal pain, diarrhea, nausea and vomiting.  Genitourinary: Negative for difficulty urinating and dysuria.  Musculoskeletal: Negative for joint swelling and myalgias.  Skin: Negative for color change and rash.  Neurological: Negative for dizziness, light-headedness and headaches.  Hematological: Negative for adenopathy. Does not bruise/bleed easily.  Psychiatric/Behavioral: Negative for agitation and dysphoric mood.       Objective:    Physical Exam Vitals reviewed.  Constitutional:      General: She is not in acute distress.    Appearance: Normal appearance. She is well-developed.  HENT:     Head: Normocephalic and atraumatic.     Right Ear: External ear normal.     Left Ear: External ear normal.  Eyes:     General: No scleral icterus.       Right eye: No discharge.        Left eye: No discharge.     Conjunctiva/sclera: Conjunctivae normal.  Neck:     Thyroid: No thyromegaly.  Cardiovascular:     Rate and Rhythm: Normal rate and regular rhythm.  Pulmonary:     Effort: No tachypnea, accessory muscle usage or respiratory distress.     Breath sounds: Normal breath sounds. No  decreased breath sounds or wheezing.  Chest:  Breasts:     Right: No inverted nipple, mass, nipple discharge or tenderness (no axillary adenopathy).     Left: No inverted nipple, mass, nipple discharge or tenderness (no axilarry adenopathy).    Abdominal:     General: Bowel sounds are normal.     Palpations: Abdomen is soft.     Tenderness: There is no abdominal tenderness.  Genitourinary:    Comments:   Musculoskeletal:        General: No swelling or tenderness.     Cervical back: Neck supple. No tenderness.  Lymphadenopathy:     Cervical: No cervical adenopathy.  Skin:    Findings: No erythema or rash.  Neurological:     Mental Status: She is alert and oriented to person, place,  and time.  Psychiatric:        Mood and Affect: Mood normal.        Behavior: Behavior normal.     BP 124/76   Pulse 77   Temp (!) 96.7 F (35.9 C)   Resp 16   Ht _0  (1.727 m)   Wt (!) 315 lb (142.9 kg)   SpO2 98%   BMI 47.90 kg/m  Wt Readings from Last 3 Encounters:  10/26/20 (!) 315 lb (142.9 kg)  09/06/20 (!) 317 lb (143.8 kg)  08/12/19 (!) 311 lb 9.6 oz (141.3 kg)     Lab Results  Component Value Date   WBC 8.7 10/26/2020   HGB 13.8 10/26/2020   HCT 41.0 10/26/2020   PLT 313.0 10/26/2020   GLUCOSE 107 (H) 10/26/2020   CHOL 224 (H) 10/26/2020   TRIG 99.0 10/26/2020   HDL 55.90 10/26/2020   LDLDIRECT 147.8 04/23/2012   LDLCALC 148 (H) 10/26/2020   ALT 32 10/26/2020   AST 19 10/26/2020   NA 137 10/26/2020   K 3.9 10/26/2020   CL 98 10/26/2020   CREATININE 0.76 10/26/2020   BUN 23 10/26/2020   CO2 27 10/26/2020   TSH 2.94 10/26/2020    US Venous Img Lower Unilateral Left  Result Date: 09/06/2020 CLINICAL DATA:  Left lower extremity swelling and redness for the past 2-3 months EXAM: LEFT LOWER EXTREMITY VENOUS DOPPLER ULTRASOUND TECHNIQUE: Gray-scale sonography with compression, as well as color and duplex ultrasound, were performed to evaluate the deep venous system(s)  from the level of the common femoral vein through the popliteal and proximal calf veins. COMPARISON:  None. FINDINGS: VENOUS Normal compressibility of the common femoral, superficial femoral, and popliteal veins, as well as the visualized calf veins. Visualized portions of profunda femoral vein and great saphenous vein unremarkable. No filling defects to suggest DVT on grayscale or color Doppler imaging. Doppler waveforms show normal direction of venous flow, normal respiratory plasticity and response to augmentation. Limited views of the contralateral common femoral vein are unremarkable. OTHER None. Limitations: none IMPRESSION: Negative. Electronically Signed   By: Jacqulynn Cadet M.D.   On: 09/06/2020 10:32       Assessment & Plan:   Problem List Items Addressed This Visit    Anxiety    Appears to be stable.  Follow.        Relevant Medications   venlafaxine XR (EFFEXOR XR) 37.5 MG 24 hr capsule   Environmental allergies    Stop allegra.  Start xyzal.  Continue flonase - on a regular basis.  Follow.       GERD (gastroesophageal reflux disease)    No acid reflux reported.  Continue protonix.       Healthcare maintenance    Physical today 10/26/20. PAP 08/12/19 - negative with negative HPV.  S/p hysterectomy.   Mammogram 08/06/20 - Birads I.  Colonoscopy 07/2017 - recommended f/u in 10 years.        Hot flashes    After stopping effexor, felt it may have been helping more than she realized.  Discussed treatment options.  Restart effexor 37.17m q day.  Follow.       Hypercholesterolemia    Low cholesterol diet and exercise.  Follow lipid panel.       Relevant Orders   Lipid panel (Completed)   Comp Met (CMET) (Completed)   Hypertension    Blood pressures as outlined.  Reviewed outside checks.  Slightly increased while on prednisone.  Recent  blood pressures averaging 120s/70- 80s.  Follow pressures.  Follow metabolic panel.       Relevant Orders   CBC with Differential/Platelet  (Completed)   TSH (Completed)    Other Visit Diagnoses    Routine general medical examination at a health care facility    -  Primary       Einar Pheasant, MD

## 2020-10-28 ENCOUNTER — Encounter: Payer: Self-pay | Admitting: Internal Medicine

## 2020-10-28 DIAGNOSIS — Z9109 Other allergy status, other than to drugs and biological substances: Secondary | ICD-10-CM | POA: Insufficient documentation

## 2020-10-28 NOTE — Assessment & Plan Note (Signed)
Blood pressures as outlined.  Reviewed outside checks.  Slightly increased while on prednisone.  Recent blood pressures averaging 120s/70- 80s.  Follow pressures.  Follow metabolic panel.

## 2020-10-28 NOTE — Assessment & Plan Note (Signed)
No acid reflux reported.  Continue protonix.  

## 2020-10-28 NOTE — Assessment & Plan Note (Signed)
Stop allegra.  Start xyzal.  Continue flonase - on a regular basis.  Follow.

## 2020-10-28 NOTE — Assessment & Plan Note (Signed)
Appears to be stable.  Follow.

## 2020-10-28 NOTE — Assessment & Plan Note (Signed)
After stopping effexor, felt it may have been helping more than she realized.  Discussed treatment options.  Restart effexor 37.5mg  q day.  Follow.

## 2020-10-28 NOTE — Assessment & Plan Note (Signed)
Low cholesterol diet and exercise.  Follow lipid panel.   

## 2020-10-30 ENCOUNTER — Telehealth: Payer: Self-pay | Admitting: *Deleted

## 2020-10-30 NOTE — Telephone Encounter (Signed)
-----   Message from Einar Pheasant, MD sent at 10/28/2020  4:49 PM EDT ----- Ortencia Kick that her cholesterol levels increased from previous check.  I recommend a low cholesterol diet and exercise.  Her fasting sugar is slightly elevated.  We will follow.  Recommend diet and exercise.  Hgb, thyroid test, kidney function tests and liver function tests are wnl.  She did not make a f/u appt.  Started on effexor.  Please schedule a 3 month f/u.  Will f/u on sugar then as well.

## 2020-11-13 ENCOUNTER — Telehealth: Payer: BC Managed Care – PPO | Admitting: Emergency Medicine

## 2020-11-13 DIAGNOSIS — M5440 Lumbago with sciatica, unspecified side: Secondary | ICD-10-CM

## 2020-11-13 MED ORDER — PREDNISONE 20 MG PO TABS: ORAL_TABLET | ORAL | 0 refills | Status: DC

## 2020-11-13 MED ORDER — MELOXICAM 15 MG PO TABS: 15.0000 mg | ORAL_TABLET | Freq: Every day | ORAL | 0 refills | Status: DC

## 2020-11-13 NOTE — Progress Notes (Signed)
We are sorry that you are not feeling well.  Here is how we plan to help!  Based on what you have shared with me it looks like you mostly have acute back pain.  Acute back pain is defined as musculoskeletal pain that can resolve in 1-3 weeks with conservative treatment.  I have prescribed Mobic and a Prednisone Taper. Some patients experience stomach irritation or in increased heartburn with anti-inflammatory drugs.  Please keep in mind that muscle relaxer's can cause fatigue and should not be taken while at work or driving.  Back pain is very common.  The pain often gets better over time.  The cause of back pain is usually not dangerous.  Most people can learn to manage their back pain on their own.  Home Care Stay active.  Start with short walks on flat ground if you can.  Try to walk farther each day. Do not sit, drive or stand in one place for more than 30 minutes.  Do not stay in bed. Do not avoid exercise or work.  Activity can help your back heal faster. Be careful when you bend or lift an object.  Bend at your knees, keep the object close to you, and do not twist. Sleep on a firm mattress.  Lie on your side, and bend your knees.  If you lie on your back, put a pillow under your knees. Only take medicines as told by your doctor. Put ice on the injured area. Put ice in a plastic bag Place a towel between your skin and the bag Leave the ice on for 15-20 minutes, 3-4 times a day for the first 2-3 days. 210 After that, you can switch between ice and heat packs. Ask your doctor about back exercises or massage. Avoid feeling anxious or stressed.  Find good ways to deal with stress, such as exercise.  Get Help Right Way If: Your pain does not go away with rest or medicine. Your pain does not go away in 1 week. You have new problems. You do not feel well. The pain spreads into your legs. You cannot control when you poop (bowel movement) or pee (urinate) You feel sick to your stomach  (nauseous) or throw up (vomit) You have belly (abdominal) pain. You feel like you may pass out (faint). If you develop a fever.  Make Sure you: Understand these instructions. Will watch your condition Will get help right away if you are not doing well or get worse.  Your e-visit answers were reviewed by a board certified advanced clinical practitioner to complete your personal care plan.  Depending on the condition, your plan could have included both over the counter or prescription medications.  If there is a problem please reply  once you have received a response from your provider.  Your safety is important to [place].  If you have drug allergies check your prescription carefully.    You can use MyChart to ask questions about today's visit, request a non-urgent call back, or ask for a work or school excuse for 24 hours related to this e-Visit. If it has been greater than 24 hours you will need to follow up with your provider, or enter a new e-Visit to address those concerns.  You will get an e-mail in the next two days asking about your experience.  I hope that your e-visit has been valuable and will speed your recovery. Thank you for using e-visits. Approximately 5 minutes was used in reviewing the patient's chart,  questionnaire, prescribing medications, and documentation.

## 2020-12-24 ENCOUNTER — Telehealth: Payer: Self-pay | Admitting: Internal Medicine

## 2020-12-24 NOTE — Telephone Encounter (Signed)
Called and spoke with Judeen Hammans. Nadeige states that she took a home test and is positive for covid. C/o body aches,headache, shoulder pain, cough, sore throat. Pt declines going to an urgent care but is agreeable to doing a mychart urgent care visit for symptoms and for medicaiton. Patient is not having any distress or SOB.

## 2020-12-24 NOTE — Telephone Encounter (Signed)
Patient tested poisitive for covid today. Symptoms are low grade fever, headache, back and shoulders hurt, chills. No appointments available. Patient was transferred to Access Nurse.

## 2020-12-24 NOTE — Telephone Encounter (Signed)
Please review.  Does this mean they are doing an e visit or is this to be worked in for appt.   Please call and confirm doing ok.

## 2020-12-24 NOTE — Telephone Encounter (Signed)
Pt was transferred back to ofc via nurse line. Pt would like to get a Rx for paxlovid. Please advise.  [facility] [id number] [address], [place] Phone:  [phone]  Fax:  [phone]    Thank you!

## 2020-12-25 ENCOUNTER — Telehealth: Payer: BC Managed Care – PPO | Admitting: Physician Assistant

## 2020-12-25 DIAGNOSIS — M5416 Radiculopathy, lumbar region: Secondary | ICD-10-CM

## 2020-12-25 NOTE — Telephone Encounter (Signed)
My chart message sent to Ms Eib for update.

## 2020-12-25 NOTE — Telephone Encounter (Signed)
Patient stated that she is doing ok. Declined visit. Patient stated that if they started to feel worse they will go to urgent care or do e-visit but right now they do not need anything. Confirmed no sob, chest tightness, fever, etc.

## 2020-12-25 NOTE — Telephone Encounter (Signed)
Zayde decided to wait on her symptoms and declined going to an urgent care. Pt stated that they will see an Evisit if their symptoms worsen or if they want antiviral medication. Providing access nurse documentation.

## 2020-12-25 NOTE — Progress Notes (Signed)
Based on what you shared with me, I feel your condition warrants further evaluation and I recommend that you be seen in a face to face visit.  Giving chronicity of symptoms and nerve involvement, this is above the scope of what we are allowed to keep treating via e-visit. Since you were diagnosed with COVID yesterday, we really have to judicious in use of steroids like prednisone as they can weaken your immune system response to infection, worsening your course of COVID-19. Giving all of this it is recommended that you be evaluated at urgent care so you can get a full evaluation and proper medical management for both COVID and the lumbar back issues until you can be seen by your Ortho. The Urgent Cares below offer appointment times so that you do not have to wait.    NOTE: There will be NO CHARGE for this eVisit   If you are having a true medical emergency please call 911.      For an urgent face to face visit, [facility] has six urgent care centers for your convenience:     [facility] [place] [facility] [place] Get Driving Directions [id number] [address] [place], [place] [address]    [facility]) Get Driving Directions [id number] [place], [place] [address]  [facility] [place] [facility]) Get Driving Directions [id number] [address] [place],  [place]  [address]  [facility] Get Driving Directions [id number] [address] [place], [place], [place] [address]   [facility] Get Driving Directions  [id number] [address] [place], [place] [address]   [facility] [place] Get Driving Directions [id number] [address], [place], [place] [address]  Your MyChart E-visit questionnaire answers were reviewed by a board certified advanced clinical  practitioner to complete your personal care plan based on your specific symptoms.  Thank you for using e-Visits.

## 2021-01-26 ENCOUNTER — Other Ambulatory Visit: Payer: Self-pay | Admitting: Internal Medicine

## 2021-03-28 ENCOUNTER — Other Ambulatory Visit: Payer: Self-pay | Admitting: Internal Medicine

## 2021-04-17 ENCOUNTER — Telehealth: Payer: Self-pay | Admitting: Internal Medicine

## 2021-04-17 NOTE — Telephone Encounter (Signed)
Per Dr Nicki Reaper, patient was called to set up a follow up appointment. Patient stated she has a $7000 deductible and only sees her when she needs to. Also patient said she just saw her 10/26/2020. Patient did not want to schedule at this time.

## 2021-04-17 NOTE — Telephone Encounter (Signed)
FYI

## 2021-04-27 ENCOUNTER — Other Ambulatory Visit: Payer: Self-pay | Admitting: Internal Medicine

## 2021-04-30 ENCOUNTER — Telehealth: Payer: BC Managed Care – PPO | Admitting: Physician Assistant

## 2021-04-30 DIAGNOSIS — M5442 Lumbago with sciatica, left side: Secondary | ICD-10-CM | POA: Diagnosis not present

## 2021-04-30 DIAGNOSIS — G8929 Other chronic pain: Secondary | ICD-10-CM | POA: Diagnosis not present

## 2021-04-30 MED ORDER — PREDNISONE 20 MG PO TABS: ORAL_TABLET | ORAL | 0 refills | Status: DC

## 2021-04-30 MED ORDER — CYCLOBENZAPRINE HCL 10 MG PO TABS
10.0000 mg | ORAL_TABLET | Freq: Three times a day (TID) | ORAL | 0 refills | Status: DC | PRN
Start: 2021-04-30 — End: 2021-08-02

## 2021-04-30 NOTE — Progress Notes (Signed)

## 2021-05-12 ENCOUNTER — Telehealth: Payer: BC Managed Care – PPO | Admitting: Emergency Medicine

## 2021-05-12 DIAGNOSIS — B9689 Other specified bacterial agents as the cause of diseases classified elsewhere: Secondary | ICD-10-CM

## 2021-05-12 DIAGNOSIS — J208 Acute bronchitis due to other specified organisms: Secondary | ICD-10-CM | POA: Diagnosis not present

## 2021-05-12 MED ORDER — PREDNISONE 10 MG (21) PO TBPK: ORAL_TABLET | Freq: Every day | ORAL | 0 refills | Status: DC

## 2021-05-12 MED ORDER — DOXYCYCLINE HYCLATE 100 MG PO TABS: 100.0000 mg | ORAL_TABLET | Freq: Two times a day (BID) | ORAL | 0 refills | Status: AC

## 2021-05-12 MED ORDER — BENZONATATE 100 MG PO CAPS: 100.0000 mg | ORAL_CAPSULE | Freq: Two times a day (BID) | ORAL | 0 refills | Status: DC | PRN

## 2021-05-12 NOTE — Progress Notes (Signed)
We are sorry that you are not feeling well.  Here is how we plan to help!  Based on your presentation I believe you most likely have A cough due to bacteria.  When patients have a fever and a productive cough with a change in color or increased sputum production, we are concerned about bacterial bronchitis.  If left untreated it can progress to pneumonia.  If your symptoms do not improve with your treatment plan it is important that you contact your provider.   I have prescribed Doxycycline 100 mg twice a day for 7 days     In addition you may use A prescription cough medication called Tessalon Perles '100mg'$ . You may take 1-2 capsules every 8 hours as needed for your cough.  Prednisone 10 mg daily for 6 days (see taper instructions below)  Directions for 6 day taper: Day 1: 2 tablets before breakfast, 1 after both lunch & dinner and 2 at bedtime Day 2: 1 tab before breakfast, 1 after both lunch & dinner and 2 at bedtime Day 3: 1 tab at each meal & 1 at bedtime Day 4: 1 tab at breakfast, 1 at lunch, 1 at bedtime Day 5: 1 tab at breakfast & 1 tab at bedtime Day 6: 1 tab at breakfast  From your responses in the eVisit questionnaire you describe inflammation in the upper respiratory tract which is causing a significant cough.  This is commonly called Bronchitis and has four common causes:   Allergies Viral Infections Acid Reflux Bacterial Infection Allergies, viruses and acid reflux are treated by controlling symptoms or eliminating the cause. An example might be a cough caused by taking certain blood pressure medications. You stop the cough by changing the medication. Another example might be a cough caused by acid reflux. Controlling the reflux helps control the cough.  USE OF BRONCHODILATOR ("RESCUE") INHALERS: There is a risk from using your bronchodilator too frequently.  The risk is that over-reliance on a medication which only relaxes the muscles surrounding the breathing tubes can reduce  the effectiveness of medications prescribed to reduce swelling and congestion of the tubes themselves.  Although you feel brief relief from the bronchodilator inhaler, your asthma may actually be worsening with the tubes becoming more swollen and filled with mucus.  This can delay other crucial treatments, such as oral steroid medications. If you need to use a bronchodilator inhaler daily, several times per day, you should discuss this with your provider.  There are probably better treatments that could be used to keep your asthma under control.     HOME CARE Only take medications as instructed by your medical team. Complete the entire course of an antibiotic. Drink plenty of fluids and get plenty of rest. Avoid close contacts especially the very young and the elderly Cover your mouth if you cough or cough into your sleeve. Always remember to wash your hands A steam or ultrasonic humidifier can help congestion.   GET HELP RIGHT AWAY IF: You develop worsening fever. You become short of breath You cough up blood. Your symptoms persist after you have completed your treatment plan MAKE SURE YOU  Understand these instructions. Will watch your condition. Will get help right away if you are not doing well or get worse.    Thank you for choosing an e-visit.  Your e-visit answers were reviewed by a board certified advanced clinical practitioner to complete your personal care plan. Depending upon the condition, your plan could have included both over the  counter or prescription medications.  Please review your pharmacy choice. Make sure the pharmacy is open so you can pick up prescription now. If there is a problem, you may contact your provider through [employer] and have the prescription routed to another pharmacy.  Your safety is important to [place]. If you have drug allergies check your prescription carefully.   For the next 24 hours you can use MyChart to ask questions about today's visit,  request a non-urgent call back, or ask for a work or school excuse. You will get an email in the next two days asking about your experience. I hope that your e-visit has been valuable and will speed your recovery.

## 2021-05-12 NOTE — Progress Notes (Signed)
I have spent 5 minutes in review of e-visit questionnaire, review and updating patient chart, medical decision making and response to patient.   Ilma Achee, PA-C    

## 2021-07-03 ENCOUNTER — Other Ambulatory Visit: Payer: Self-pay | Admitting: Internal Medicine

## 2021-07-25 ENCOUNTER — Other Ambulatory Visit: Payer: Self-pay | Admitting: Internal Medicine

## 2021-08-02 ENCOUNTER — Telehealth: Payer: BC Managed Care – PPO | Admitting: Physician Assistant

## 2021-08-02 DIAGNOSIS — M5441 Lumbago with sciatica, right side: Secondary | ICD-10-CM

## 2021-08-02 MED ORDER — PREDNISONE 10 MG (21) PO TBPK: ORAL_TABLET | Freq: Every day | ORAL | 0 refills | Status: DC

## 2021-08-02 NOTE — Progress Notes (Signed)
We are sorry that you are not feeling well.  Here is how we plan to help! ? ?Based on what you have shared with me it looks like you mostly have acute back pain. ? ?Acute back pain is defined as musculoskeletal pain that can resolve in 1-3 weeks with conservative treatment. ? ?I have prescribed a prednisone tablet pack taper. Some patients experience stomach irritation or in increased heartburn with anti-inflammatory drugs.  Back pain is very common.  The pain often gets better over time.  The cause of back pain is usually not dangerous.  Most people can learn to manage their back pain on their own. ? ?Home Care ?Stay active.  Start with short walks on flat ground if you can.  Try to walk farther each day. ?Do not sit, drive or stand in one place for more than 30 minutes.  Do not stay in bed. ?Do not avoid exercise or work.  Activity can help your back heal faster. ?Be careful when you bend or lift an object.  Bend at your knees, keep the object close to you, and do not twist. ?Sleep on a firm mattress.  Lie on your side, and bend your knees.  If you lie on your back, put a pillow under your knees. ?Only take medicines as told by your doctor. ?Put ice on the injured area. ?Put ice in a plastic bag ?Place a towel between your skin and the bag ?Leave the ice on for 15-20 minutes, 3-4 times a day for the first 2-3 days. 210 After that, you can switch between ice and heat packs. ?Ask your doctor about back exercises or massage. ?Avoid feeling anxious or stressed.  Find good ways to deal with stress, such as exercise. ? ?Get Help Right Way If: ?Your pain does not go away with rest or medicine. ?Your pain does not go away in 1 week. ?You have new problems. ?You do not feel well. ?The pain spreads into your legs. ?You cannot control when you poop (bowel movement) or pee (urinate) ?You feel sick to your stomach (nauseous) or throw up (vomit) ?You have belly (abdominal) pain. ?You feel like you may pass out (faint). ?If you  develop a fever. ? ?Make Sure you: ?Understand these instructions. ?Will watch your condition ?Will get help right away if you are not doing well or get worse. ? ?Your e-visit answers were reviewed by a board certified advanced clinical practitioner to complete your personal care plan.  Depending on the condition, your plan could have included both over the counter or prescription medications. ? ?If there is a problem please reply  once you have received a response from your provider. ? ?Your safety is important to [place].  If you have drug allergies check your prescription carefully.   ? ?You can use MyChart to ask questions about today?s visit, request a non-urgent call back, or ask for a work or school excuse for 24 hours related to this e-Visit. If it has been greater than 24 hours you will need to follow up with your provider, or enter a new e-Visit to address those concerns. ? ?You will get an e-mail in the next two days asking about your experience.  I hope that your e-visit has been valuable and will speed your recovery. Thank you for using e-visits. ? ?

## 2021-08-02 NOTE — Progress Notes (Signed)
I have spent 5 minutes in review of e-visit questionnaire, review and updating patient chart, medical decision making and response to patient.   Doye Montilla Cody Kuulei Kleier, PA-C    

## 2021-08-03 ENCOUNTER — Other Ambulatory Visit: Payer: Self-pay | Admitting: Internal Medicine

## 2021-09-24 ENCOUNTER — Other Ambulatory Visit: Payer: Self-pay | Admitting: Internal Medicine

## 2021-09-24 DIAGNOSIS — Z1231 Encounter for screening mammogram for malignant neoplasm of breast: Secondary | ICD-10-CM

## 2021-10-25 ENCOUNTER — Ambulatory Visit
Admission: RE | Admit: 2021-10-25 | Discharge: 2021-10-25 | Disposition: A | Discharge status: Home / Self Care | Payer: BC Managed Care – PPO | Source: Ambulatory Visit | Attending: Internal Medicine | Admitting: Internal Medicine

## 2021-10-25 DIAGNOSIS — Z1231 Encounter for screening mammogram for malignant neoplasm of breast: Secondary | ICD-10-CM | POA: Diagnosis present

## 2021-10-26 ENCOUNTER — Telehealth: Payer: BC Managed Care – PPO | Admitting: Physician Assistant

## 2021-10-26 ENCOUNTER — Other Ambulatory Visit: Payer: Self-pay | Admitting: Internal Medicine

## 2021-10-26 DIAGNOSIS — M5441 Lumbago with sciatica, right side: Secondary | ICD-10-CM | POA: Diagnosis not present

## 2021-10-26 DIAGNOSIS — M5416 Radiculopathy, lumbar region: Secondary | ICD-10-CM

## 2021-10-26 MED ORDER — PREDNISONE 10 MG (21) PO TBPK: ORAL_TABLET | Freq: Every day | ORAL | 0 refills | Status: DC

## 2021-10-26 NOTE — Progress Notes (Signed)
We are sorry that you are not feeling well.  Here is how we plan to help!  Based on what you have shared with me it looks like you mostly have acute back pain.  Acute back pain is defined as musculoskeletal pain that can resolve in 1-3 weeks with conservative treatment.  I have prescribed a steroid taper. Some patients experience stomach irritation or in increased heartburn with anti-inflammatory drugs.  Please keep in mind that muscle relaxer's can cause fatigue and should not be taken while at work or driving.  Back pain is very common.  The pain often gets better over time.  The cause of back pain is usually not dangerous.  Most people can learn to manage their back pain on their own.  Home Care Stay active.  Start with short walks on flat ground if you can.  Try to walk farther each day. Do not sit, drive or stand in one place for more than 30 minutes.  Do not stay in bed. Do not avoid exercise or work.  Activity can help your back heal faster. Be careful when you bend or lift an object.  Bend at your knees, keep the object close to you, and do not twist. Sleep on a firm mattress.  Lie on your side, and bend your knees.  If you lie on your back, put a pillow under your knees. Only take medicines as told by your doctor. Put ice on the injured area. Put ice in a plastic bag Place a towel between your skin and the bag Leave the ice on for 15-20 minutes, 3-4 times a day for the first 2-3 days. 210 After that, you can switch between ice and heat packs. Ask your doctor about back exercises or massage. Avoid feeling anxious or stressed.  Find good ways to deal with stress, such as exercise.  Get Help Right Way If: Your pain does not go away with rest or medicine. Your pain does not go away in 1 week. You have new problems. You do not feel well. The pain spreads into your legs. You cannot control when you poop (bowel movement) or pee (urinate) You feel sick to your stomach (nauseous) or  throw up (vomit) You have belly (abdominal) pain. You feel like you may pass out (faint). If you develop a fever.  Make Sure you: Understand these instructions. Will watch your condition Will get help right away if you are not doing well or get worse.  Your e-visit answers were reviewed by a board certified advanced clinical practitioner to complete your personal care plan.  Depending on the condition, your plan could have included both over the counter or prescription medications.  If there is a problem please reply  once you have received a response from your provider.  Your safety is important to [place].  If you have drug allergies check your prescription carefully.    You can use MyChart to ask questions about today's visit, request a non-urgent call back, or ask for a work or school excuse for 24 hours related to this e-Visit. If it has been greater than 24 hours you will need to follow up with your provider, or enter a new e-Visit to address those concerns.  You will get an e-mail in the next two days asking about your experience.  I hope that your e-visit has been valuable and will speed your recovery. Thank you for using e-visits.  This appointment required 5-10 minutes of patient care (this includes precharting,  chart review, review of results, face-to-face care, etc.).  Inda Coke PA-C

## 2021-11-01 ENCOUNTER — Encounter: Payer: Self-pay | Admitting: Internal Medicine

## 2021-11-01 ENCOUNTER — Ambulatory Visit (INDEPENDENT_AMBULATORY_CARE_PROVIDER_SITE_OTHER): Payer: BC Managed Care – PPO | Admitting: Internal Medicine

## 2021-11-01 VITALS — BP 140/78 | HR 65 | Temp 97.2°F | Resp 17 | Ht 68.0 in | Wt 317.4 lb

## 2021-11-01 DIAGNOSIS — E7800 Pure hypercholesterolemia, unspecified: Secondary | ICD-10-CM

## 2021-11-01 DIAGNOSIS — K219 Gastro-esophageal reflux disease without esophagitis: Secondary | ICD-10-CM

## 2021-11-01 DIAGNOSIS — Z0000 Encounter for general adult medical examination without abnormal findings: Secondary | ICD-10-CM

## 2021-11-01 DIAGNOSIS — Z1159 Encounter for screening for other viral diseases: Secondary | ICD-10-CM

## 2021-11-01 DIAGNOSIS — I10 Essential (primary) hypertension: Secondary | ICD-10-CM | POA: Diagnosis not present

## 2021-11-01 DIAGNOSIS — R232 Flushing: Secondary | ICD-10-CM

## 2021-11-01 DIAGNOSIS — M5441 Lumbago with sciatica, right side: Secondary | ICD-10-CM

## 2021-11-01 LAB — CBC WITH DIFFERENTIAL/PLATELET
Basophils Absolute: 0.1 10*3/uL (ref 0.0–0.1)
Basophils Relative: 1.1 % (ref 0.0–3.0)
Eosinophils Absolute: 0.2 10*3/uL (ref 0.0–0.7)
Eosinophils Relative: 2 % (ref 0.0–5.0)
HCT: 40.3 % (ref 36.0–46.0)
Hemoglobin: 13 g/dL (ref 12.0–15.0)
Lymphocytes Relative: 42.1 % (ref 12.0–46.0)
Lymphs Abs: 4.7 10*3/uL — ABNORMAL HIGH (ref 0.7–4.0)
MCHC: 32.4 g/dL (ref 30.0–36.0)
MCV: 83.8 fl (ref 78.0–100.0)
Monocytes Absolute: 0.7 10*3/uL (ref 0.1–1.0)
Monocytes Relative: 6.2 % (ref 3.0–12.0)
Neutro Abs: 5.5 10*3/uL (ref 1.4–7.7)
Neutrophils Relative %: 48.6 % (ref 43.0–77.0)
Platelets: 305 10*3/uL (ref 150.0–400.0)
RBC: 4.81 Mil/uL (ref 3.87–5.11)
RDW: 14.6 % (ref 11.5–15.5)
WBC: 11.2 10*3/uL — ABNORMAL HIGH (ref 4.0–10.5)

## 2021-11-01 LAB — TSH: TSH: 3.71 u[IU]/mL (ref 0.35–5.50)

## 2021-11-01 LAB — COMPREHENSIVE METABOLIC PANEL
ALT: 26 U/L (ref 0–35)
AST: 13 U/L (ref 0–37)
Albumin: 4.1 g/dL (ref 3.5–5.2)
Alkaline Phosphatase: 78 U/L (ref 39–117)
BUN: 24 mg/dL — ABNORMAL HIGH (ref 6–23)
CO2: 32 mEq/L (ref 19–32)
Calcium: 9.5 mg/dL (ref 8.4–10.5)
Chloride: 100 mEq/L (ref 96–112)
Creatinine, Ser: 0.82 mg/dL (ref 0.40–1.20)
GFR: 80.59 mL/min (ref >60.00)
Glucose, Bld: 70 mg/dL (ref 70–99)
Potassium: 3.5 mEq/L (ref 3.5–5.1)
Sodium: 142 mEq/L (ref 135–145)
Total Bilirubin: 0.7 mg/dL (ref 0.2–1.2)
Total Protein: 6.6 g/dL (ref 6.0–8.3)

## 2021-11-01 LAB — LIPID PANEL
Cholesterol: 221 mg/dL — ABNORMAL HIGH (ref 0–200)
HDL: 58.5 mg/dL (ref >39.00)
LDL Cholesterol: 130 mg/dL — ABNORMAL HIGH (ref 0–99)
NonHDL: 162.88
Total CHOL/HDL Ratio: 4
Triglycerides: 166 mg/dL — ABNORMAL HIGH (ref 0.0–149.0)
VLDL: 33.2 mg/dL (ref 0.0–40.0)

## 2021-11-01 MED ORDER — LEVOCETIRIZINE DIHYDROCHLORIDE 5 MG PO TABS
5.0000 mg | ORAL_TABLET | Freq: Every day | ORAL | 3 refills | Status: DC
Start: 2021-11-01 — End: 2022-04-23

## 2021-11-01 MED ORDER — VENLAFAXINE HCL ER 37.5 MG PO CP24: ORAL_CAPSULE | ORAL | 3 refills | Status: DC

## 2021-11-01 MED ORDER — PANTOPRAZOLE SODIUM 40 MG PO TBEC
40.0000 mg | DELAYED_RELEASE_TABLET | Freq: Two times a day (BID) | ORAL | 1 refills | Status: DC
Start: 2021-11-01 — End: 2023-10-29

## 2021-11-01 MED ORDER — ALPRAZOLAM 0.25 MG PO TABS: 0.2500 mg | ORAL_TABLET | Freq: Every day | ORAL | 0 refills | Status: DC | PRN

## 2021-11-01 MED ORDER — FLUTICASONE PROPIONATE 50 MCG/ACT NA SUSP
2.0000 | Freq: Every day | NASAL | 6 refills | Status: DC
Start: 2021-11-01 — End: 2022-12-31

## 2021-11-01 NOTE — Progress Notes (Unsigned)
Patient ID: Jenny Stephenson, female   DOB: [birth year], 56 y.o.   MRN: [id number]   Subjective:    Patient ID: Jenny Stephenson, female    DOB: 09-18-1965, 66 y.o.   MRN: [id number]  This visit occurred during the SARS-CoV-2 public health emergency.  Safety protocols were in place, including screening questions prior to the visit, additional usage of staff PPE, and extensive cleaning of exam room while observing appropriate contact time as indicated for disinfecting solutions.   Patient here for  Chief Complaint  Patient presents with   Annual Exam    CPE   .   HPI Here for her physical exam.  E visit recently for right lower back pain and pain extending into her right leg. Pain has been present for a few weeks.  Pain occurs with sitting or going from a seated to standing position. Was prescribed a steroid taper.     Past Medical History:  Diagnosis Date   Carpal tunnel syndrome    bilateral   Hypertension    Obesity    Sciatica of left side 02/28/2014   Past Surgical History:  Procedure Laterality Date   arm surgery  1999   screws/plates   CARPAL TUNNEL RELEASE  2008   CESAREAN SECTION  1993 and 2001   cholescystectomy  1995   COLONOSCOPY WITH PROPOFOL N/A 07/24/2017   Procedure: COLONOSCOPY WITH PROPOFOL;  Surgeon: [name], MD;  Location: [facility];  Service: Endoscopy;  Laterality: N/A;   POLYPECTOMY  07/24/2017   Procedure: POLYPECTOMY;  Surgeon: [name], MD;  Location: [facility];  Service: Endoscopy;;   [place]  2004   ovaries not removed, secondary to fibroids and bleeding   Family History  Problem Relation Age of Onset   Prostate cancer Father    Lymphoma Father    Hypertension Mother    Ulcerative colitis Mother    Colon polyps Mother    Kidney cancer Mother    Heart disease Maternal Grandmother        myocardial infarction   Colon cancer Neg Hx    Breast cancer Neg Hx    Social History    Socioeconomic History   Marital status: Married    Spouse name: Jaycey Gens   Number of children: 2   Years of education: college   Highest education level: Not on file  Occupational History   Occupation: Scientist, forensic: SELF EMPLOYED  Tobacco Use   Smoking status: Never   Smokeless tobacco: Never  Substance and Sexual Activity   Alcohol use: No    Alcohol/week: 0.0 standard drinks   Drug use: No   Sexual activity: Yes    Birth control/protection: Surgical  Other Topics Concern   Not on file  Social History Narrative   Not on file   Social Determinants of Health   Financial Resource Strain: Not on file  Food Insecurity: Not on file  Transportation Needs: Not on file  Physical Activity: Not on file  Stress: Not on file  Social Connections: Not on file     Review of Systems     Objective:     BP 140/78 (BP Location: Left Arm, Patient Position: Sitting, Cuff Size: Large)   Pulse 65   Temp (!) 97.2 F (36.2 C) (Temporal)   Resp 17   Ht '5\' 8"'$  (1.727 m)   Wt (!) 317 lb 6.4 oz (144 kg)   SpO2  100%   BMI 48.26 kg/m  Wt Readings from Last 3 Encounters:  11/01/21 (!) 317 lb 6.4 oz (144 kg)  10/26/20 (!) 315 lb (142.9 kg)  09/06/20 (!) 317 lb (143.8 kg)    Physical Exam   Outpatient Encounter Medications as of 11/01/2021  Medication Sig   ALPRAZolam (XANAX) 0.25 MG tablet Take 1 tablet (0.25 mg total) by mouth daily as needed for anxiety.   fluticasone (FLONASE) 50 MCG/ACT nasal spray Place 2 sprays into both nostrils daily.   ibuprofen (ADVIL,MOTRIN) 200 MG tablet Take 200 mg by mouth every 6 (six) hours as needed.   levocetirizine (XYZAL) 5 MG tablet TAKE ONE TABLET BY MOUTH DAILY   pantoprazole (PROTONIX) 40 MG tablet TAKE ONE TABLET BY MOUTH TWICE A DAY   predniSONE (STERAPRED UNI-PAK 21 TAB) 10 MG (21) TBPK tablet Take by mouth daily. Take 6 tabs by mouth daily  for 2 days, then 5 tabs for 2 days, then 4 tabs for 2 days, then 3 tabs  for 2 days, 2 tabs for 2 days, then 1 tab by mouth daily for 2 days   venlafaxine XR (EFFEXOR-XR) 37.5 MG 24 hr capsule TAKE ONE CAPSULE BY MOUTH DAILY WITH BREAKFAST   No facility-administered encounter medications on file as of 11/01/2021.     Lab Results  Component Value Date   WBC 8.7 10/26/2020   HGB 13.8 10/26/2020   HCT 41.0 10/26/2020   PLT 313.0 10/26/2020   GLUCOSE 107 (H) 10/26/2020   CHOL 224 (H) 10/26/2020   TRIG 99.0 10/26/2020   HDL 55.90 10/26/2020   LDLDIRECT 147.8 04/23/2012   LDLCALC 148 (H) 10/26/2020   ALT 32 10/26/2020   AST 19 10/26/2020   NA 137 10/26/2020   K 3.9 10/26/2020   CL 98 10/26/2020   CREATININE 0.76 10/26/2020   BUN 23 10/26/2020   CO2 27 10/26/2020   TSH 2.94 10/26/2020    MM 3D SCREEN BREAST BILATERAL  Result Date: 10/29/2021 CLINICAL DATA:  Screening. EXAM: DIGITAL SCREENING BILATERAL MAMMOGRAM WITH TOMOSYNTHESIS AND CAD TECHNIQUE: Bilateral screening digital craniocaudal and mediolateral oblique mammograms were obtained. Bilateral screening digital breast tomosynthesis was performed. The images were evaluated with computer-aided detection. COMPARISON:  Previous exam(s). ACR Breast Density Category a: The breast tissue is almost entirely fatty. FINDINGS: There are no findings suspicious for malignancy. IMPRESSION: No mammographic evidence of malignancy. A result letter of this screening mammogram will be mailed directly to the patient. RECOMMENDATION: Screening mammogram in one year. (Code:SM-B-01Y) BI-RADS CATEGORY  1: Negative. Electronically Signed   By: Everlean Alstrom M.D.   On: 10/29/2021 10:44       Assessment & Plan:   Problem List Items Addressed This Visit     Healthcare maintenance    Physical today 11/01/21. PAP 08/12/19 - negative with negative HPV.  Mammogram        Hypercholesterolemia - Primary   Hypertension   Other Visit Diagnoses     Need for hepatitis C screening test            Einar Pheasant, MD

## 2021-11-01 NOTE — Assessment & Plan Note (Addendum)
Physical today 11/01/21. PAP 08/12/19 - negative with negative HPV.  Mammogram 10/29/21 - Birads I. colonoscopy 07/2017.  Recommended f/u colonoscopy in 10 years.

## 2021-11-02 ENCOUNTER — Encounter: Payer: Self-pay | Admitting: Internal Medicine

## 2021-11-02 DIAGNOSIS — M545 Low back pain, unspecified: Secondary | ICD-10-CM | POA: Insufficient documentation

## 2021-11-02 MED ORDER — TIZANIDINE HCL 4 MG PO TABS: 4.0000 mg | ORAL_TABLET | Freq: Every evening | ORAL | 0 refills | Status: DC | PRN

## 2021-11-02 NOTE — Assessment & Plan Note (Signed)
Blood pressure a little elevated today.  Have her spot check her pressure.  Pain could be contributing.  Send in readings.  May need to add medication.  Follow.  Check metabolic panel.  Schedule f/u appt to reassess.

## 2021-11-02 NOTE — Assessment & Plan Note (Signed)
On effexor.  Stable.  Continue.

## 2021-11-02 NOTE — Assessment & Plan Note (Signed)
Low cholesterol diet and exercise.  Follow lipid panel.   

## 2021-11-02 NOTE — Telephone Encounter (Signed)
Rx sent in for tizanidine #20 with no refills.

## 2021-11-02 NOTE — Assessment & Plan Note (Signed)
No acid reflux reported.  Continue protonix.  

## 2021-11-02 NOTE — Assessment & Plan Note (Signed)
Right low back pain and pain into right hip/thigh/leg.  Has been an issue for a while now. Intermittent flares.  On medrol dosepak now.  Discussed PT.  Will let me know if desires to pursue therapy.  Does not feel antiinflammatories help.  Discussed muscle relaxer.

## 2021-11-04 LAB — HEPATITIS C ANTIBODY
Hepatitis C Ab: NONREACTIVE
SIGNAL TO CUT-OFF: 0.06 (ref <1.00)

## 2021-12-09 ENCOUNTER — Telehealth: Payer: Self-pay

## 2021-12-09 DIAGNOSIS — M5441 Lumbago with sciatica, right side: Secondary | ICD-10-CM

## 2021-12-09 NOTE — Telephone Encounter (Signed)
Notify - order placed for PT referral. Someone should contact her to schedule appt.  Let us know if any problems.

## 2021-12-09 NOTE — Telephone Encounter (Signed)
Patient states Dr. Einar Pheasant asked her to let her know who she would like to see for physical therapy.  Patient states she would like to be referred to [facility], DPT, at [facility] in [place].  Patient states their fax number is [phone].  Patient states that [facility], DPT's office states that they are also in [place].

## 2021-12-10 NOTE — Telephone Encounter (Signed)
Lm for pt to cb.

## 2021-12-17 ENCOUNTER — Emergency Department: Payer: BC Managed Care – PPO

## 2021-12-17 ENCOUNTER — Emergency Department
Admission: EM | Admit: 2021-12-17 | Discharge: 2021-12-17 | Disposition: A | Discharge status: Home / Self Care | Payer: BC Managed Care – PPO | Attending: Emergency Medicine | Admitting: Emergency Medicine

## 2021-12-17 ENCOUNTER — Encounter: Payer: Self-pay | Admitting: Emergency Medicine

## 2021-12-17 DIAGNOSIS — Y92410 Unspecified street and highway as the place of occurrence of the external cause: Secondary | ICD-10-CM | POA: Diagnosis not present

## 2021-12-17 DIAGNOSIS — I10 Essential (primary) hypertension: Secondary | ICD-10-CM | POA: Diagnosis not present

## 2021-12-17 DIAGNOSIS — M545 Low back pain, unspecified: Secondary | ICD-10-CM | POA: Insufficient documentation

## 2021-12-17 MED ORDER — PREDNISONE 10 MG (21) PO TBPK: ORAL_TABLET | ORAL | 0 refills | Status: DC

## 2021-12-17 MED ORDER — LIDOCAINE 5 % EX PTCH
1.0000 | MEDICATED_PATCH | CUTANEOUS | Status: DC
  Administered 2021-12-17: 1 via TRANSDERMAL
  Filled 2021-12-17: qty 1

## 2021-12-17 MED ORDER — LIDOCAINE 5 % EX PTCH: 1.0000 | MEDICATED_PATCH | Freq: Two times a day (BID) | CUTANEOUS | 0 refills | Status: AC
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
## 2021-12-17 NOTE — Discharge Instructions (Signed)
You may take the medications as prescribed in addition to Tylenol 650 mg every 6-8 hours and/or ibuprofen 600 mg every 6-8 hours.  Please return for any new, worsening, or change in symptoms or other concerns including abdominal pain, numbness or tingling in your arms or legs, weakness in your arms or legs, repetitive vomiting, changes in your vision, or any other concerns.
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
## 2021-12-17 NOTE — Telephone Encounter (Signed)
Patient states she hasn't heard from anyone regarding her referral to physical therapy.  I read Dr. Randell Patient Scott's note to patient.  I let her know it was sent to [facility].  Patient states she will go ahead and give them a call.

## 2021-12-18 ENCOUNTER — Other Ambulatory Visit: Payer: Self-pay | Admitting: Internal Medicine
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
## 2021-12-19 NOTE — Telephone Encounter (Signed)
Additional refills okay?
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
## 2022-01-10 ENCOUNTER — Ambulatory Visit: Payer: BC Managed Care – PPO | Admitting: Internal Medicine

## 2022-02-12 ENCOUNTER — Ambulatory Visit: Payer: BC Managed Care – PPO | Admitting: Internal Medicine

## 2022-04-14 ENCOUNTER — Ambulatory Visit: Payer: BC Managed Care – PPO | Admitting: Internal Medicine

## 2022-04-23 ENCOUNTER — Other Ambulatory Visit: Payer: Self-pay | Admitting: Internal Medicine

## 2022-06-27 ENCOUNTER — Other Ambulatory Visit: Payer: Self-pay | Admitting: Internal Medicine

## 2022-07-17 ENCOUNTER — Telehealth: Payer: BC Managed Care – PPO | Admitting: Family Medicine

## 2022-07-17 DIAGNOSIS — G8929 Other chronic pain: Secondary | ICD-10-CM

## 2022-07-17 NOTE — Progress Notes (Signed)
Jenny Stephenson   Seen by ortho advised to see spine dr due to pain. Received bilateral steroid knee injections 07/07/22  Needs to follow up in person given the concerns and pain.

## 2022-09-19 ENCOUNTER — Other Ambulatory Visit: Payer: Self-pay | Admitting: Internal Medicine

## 2022-09-19 NOTE — Telephone Encounter (Signed)
Sent pt a mychart msg to make a future appt pt last OV was 11-01-21 with no future one made. Informed her that other refills can be addressed at that time as well.

## 2022-10-30 ENCOUNTER — Other Ambulatory Visit: Payer: Self-pay | Admitting: Internal Medicine

## 2022-10-30 DIAGNOSIS — Z1231 Encounter for screening mammogram for malignant neoplasm of breast: Secondary | ICD-10-CM

## 2022-11-02 ENCOUNTER — Other Ambulatory Visit: Payer: Self-pay | Admitting: Internal Medicine

## 2022-11-14 ENCOUNTER — Ambulatory Visit
Admission: RE | Admit: 2022-11-14 | Discharge: 2022-11-14 | Disposition: A | Discharge status: Home / Self Care | Payer: BC Managed Care – PPO | Source: Ambulatory Visit | Attending: Internal Medicine | Admitting: Internal Medicine

## 2022-11-14 DIAGNOSIS — Z1231 Encounter for screening mammogram for malignant neoplasm of breast: Secondary | ICD-10-CM | POA: Diagnosis present

## 2022-12-26 ENCOUNTER — Encounter: Payer: BC Managed Care – PPO | Admitting: Internal Medicine

## 2022-12-31 ENCOUNTER — Ambulatory Visit (INDEPENDENT_AMBULATORY_CARE_PROVIDER_SITE_OTHER): Payer: BC Managed Care – PPO

## 2022-12-31 ENCOUNTER — Encounter: Payer: Self-pay | Admitting: Podiatry

## 2022-12-31 ENCOUNTER — Ambulatory Visit (INDEPENDENT_AMBULATORY_CARE_PROVIDER_SITE_OTHER): Payer: BC Managed Care – PPO | Admitting: Podiatry

## 2022-12-31 DIAGNOSIS — M778 Other enthesopathies, not elsewhere classified: Secondary | ICD-10-CM

## 2022-12-31 DIAGNOSIS — M7751 Other enthesopathy of right foot: Secondary | ICD-10-CM | POA: Diagnosis not present

## 2022-12-31 DIAGNOSIS — E669 Obesity, unspecified: Secondary | ICD-10-CM | POA: Insufficient documentation

## 2022-12-31 DIAGNOSIS — G5792 Unspecified mononeuropathy of left lower limb: Secondary | ICD-10-CM

## 2022-12-31 DIAGNOSIS — M179 Osteoarthritis of knee, unspecified: Secondary | ICD-10-CM | POA: Insufficient documentation

## 2022-12-31 MED ORDER — TRIAMCINOLONE ACETONIDE 40 MG/ML IJ SUSP
40.0000 mg | Freq: Once | INTRAMUSCULAR | Status: AC
  Administered 2022-12-31: 40 mg

## 2022-12-31 NOTE — Progress Notes (Signed)
Subjective:  Patient ID: Jenny Stephenson, female    DOB: [birth year],  MRN: [id number] HPI Chief Complaint  Patient presents with   Foot Pain    Dorsal midfoot left - aching x years intermittent, but since May 2024 its been more constant, swelling and discolored, radiating to instep  Plantar forefoot right - burning and sharp x 1 year, worsening, only happen when walking   New Patient (Initial Visit)    57 y.o. female presents with the above complaint.   ROS: Denies fever chills nausea vomiting muscle aches pains calf pain back pain chest pain shortness of breath.  Past Medical History:  Diagnosis Date   Carpal tunnel syndrome    bilateral   Hypertension    Obesity    Sciatica of left side 02/28/2014   Past Surgical History:  Procedure Laterality Date   arm surgery  1999   screws/plates   CARPAL TUNNEL RELEASE  2008   CESAREAN SECTION  1993 and 2001   cholescystectomy  1995   COLONOSCOPY WITH PROPOFOL N/A 07/24/2017   Procedure: COLONOSCOPY WITH PROPOFOL;  Surgeon: Midge Minium, MD;  Location: [facility];  Service: Endoscopy;  Laterality: N/A;   POLYPECTOMY  07/24/2017   Procedure: POLYPECTOMY;  Surgeon: Midge Minium, MD;  Location: [facility];  Service: Endoscopy;;   TONSILLECTOMY  1973   VAGINAL HYSTERECTOMY  2004   ovaries not removed, secondary to fibroids and bleeding    Current Outpatient Medications:    diclofenac (VOLTAREN) 75 MG EC tablet, Take 75 mg by mouth 2 (two) times daily., Disp: , Rfl:    ALPRAZolam (XANAX) 0.25 MG tablet, Take 1 tablet (0.25 mg total) by mouth daily as needed for anxiety., Disp: 30 tablet, Rfl: 0   gabapentin (NEURONTIN) 300 MG capsule, Take 300 mg by mouth at bedtime as needed., Disp: , Rfl:    ibuprofen (ADVIL,MOTRIN) 200 MG tablet, Take 200 mg by mouth every 6 (six) hours as needed., Disp: , Rfl:    levocetirizine (XYZAL) 5 MG tablet, TAKE 1 TABLET BY MOUTH DAILY, Disp: 30 tablet, Rfl: 3   pantoprazole (PROTONIX)  40 MG tablet, Take 1 tablet (40 mg total) by mouth 2 (two) times daily., Disp: 180 tablet, Rfl: 1   venlafaxine XR (EFFEXOR-XR) 37.5 MG 24 hr capsule, TAKE 1 CAPSULE BY MOUTH DAILY WITH BREAKFAST, Disp: 90 capsule, Rfl: 1  Allergies  Allergen Reactions   Tramadol     Other Reaction(s): insomnia, Other (See Comments)  Itching   Itching   Codeine Itching and Other (See Comments)    Hyper Hyper Hyper   Review of Systems Objective:  There were no vitals filed for this visit.  General: Well developed, nourished, in no acute distress, alert and oriented x3   Dermatological: Skin is warm, dry and supple bilateral. Nails x 10 are well maintained; remaining integument appears unremarkable at this time. There are no open sores, no preulcerative lesions, no rash or signs of infection present.  Vascular: Dorsalis Pedis artery and Posterior Tibial artery pedal pulses are 2/4 bilateral with immedate capillary fill time. Pedal hair growth present. No varicosities and no lower extremity edema present bilateral.   Neruologic: Grossly intact via light touch bilateral. Vibratory intact via tuning fork bilateral. Protective threshold with Semmes Wienstein monofilament intact to all pedal sites bilateral. Patellar and Achilles deep tendon reflexes 2+ bilateral. No Babinski or clonus noted bilateral.   Musculoskeletal: No gross boney pedal deformities bilateral. No pain, crepitus, or limitation noted with foot  and ankle range of motion bilateral. Muscular strength 5/5 in all groups tested bilateral.  Pain on palpation and end range of motion of the second and third metatarsophalangeal joints of the right foot.  With pain on direct palpation of a palpable nonpulsatile mass to the dorsal aspect of the left foot.  This is consistent with osteoarthritis and neuritis on the left foot with a plantar plate tear.  Also demonstrating osteoarthritic changes and capsulitis right foot as well.  Gait: Unassisted,  Nonantalgic.    Radiographs:  Radiographs taken today demonstrate an osseously mature individual left foot demonstrates mild demineralization of the bone diastases between the second and third toes with medial deviation of the second toe and hammertoe deformity on the lateral view.  She has osteoarthritic changes dorsal aspect midfoot left as well as midfoot right with mild hammertoe deformity second right as well she has long plantarflexed second metatarsals bilateral.  Assessment & Plan:   Assessment: Osteoarthritis dorsal aspect bilateral foot left greater than right capsulitis second metatarsophalangeal joint right foot.  Plan: Discussed appropriate shoe gear stretching exercise ice therapy sugar modifications discussed etiology pathology conservative versus surgical therapies.  I injected the dorsal aspect of the left foot with 10 mg Kenalog 5 mg and Marcaine.  Injected the second interdigital space of the right foot with 10 mg Kenalog and Marcaine.  Tolerated procedure well I will follow-up with her in 1 month if not completely resolved.     Lesbia [place] T. [place], [place]

## 2023-01-29 ENCOUNTER — Ambulatory Visit: Payer: BC Managed Care – PPO | Admitting: Internal Medicine

## 2023-01-29 VITALS — BP 132/78 | HR 74 | Temp 98.1°F | Ht 68.0 in | Wt 309.2 lb

## 2023-01-29 DIAGNOSIS — M25551 Pain in right hip: Secondary | ICD-10-CM | POA: Diagnosis not present

## 2023-01-29 DIAGNOSIS — Z13220 Encounter for screening for lipoid disorders: Secondary | ICD-10-CM

## 2023-01-29 DIAGNOSIS — F419 Anxiety disorder, unspecified: Secondary | ICD-10-CM | POA: Diagnosis not present

## 2023-01-29 DIAGNOSIS — Z713 Dietary counseling and surveillance: Secondary | ICD-10-CM

## 2023-01-29 DIAGNOSIS — K219 Gastro-esophageal reflux disease without esophagitis: Secondary | ICD-10-CM

## 2023-01-29 DIAGNOSIS — Z9109 Other allergy status, other than to drugs and biological substances: Secondary | ICD-10-CM

## 2023-01-29 DIAGNOSIS — R232 Flushing: Secondary | ICD-10-CM

## 2023-01-29 DIAGNOSIS — M2550 Pain in unspecified joint: Secondary | ICD-10-CM

## 2023-01-29 DIAGNOSIS — R002 Palpitations: Secondary | ICD-10-CM | POA: Insufficient documentation

## 2023-01-29 MED ORDER — WEGOVY 0.25 MG/0.5ML ~~LOC~~ SOAJ
0.2500 mg | SUBCUTANEOUS | 2 refills | Status: DC
Start: 2023-01-29 — End: 2023-03-16

## 2023-01-29 MED ORDER — LEVOCETIRIZINE DIHYDROCHLORIDE 5 MG PO TABS: 5.0000 mg | ORAL_TABLET | Freq: Every day | ORAL | 3 refills | Status: DC

## 2023-01-29 NOTE — Progress Notes (Signed)
Subjective:    Patient ID: Jenny Stephenson, female    DOB: 1966/05/15, 57 y.o.   MRN: [id number]  Patient here for  Chief Complaint  Patient presents with   Medical Management of Chronic Issues    HPI Work in appt - weight loss and bursitis.  Last seen 11/01/21.  Had questions about weight loss medication.  Discussed diet and exercise.  Discussed GLP 1 agonist.  Discussed possible side effects or medication.  She report increased hip, leg and knee pain.  Has seen ortho.  S/p injection - right hip.  Initially 50% relief.  Last week - injection - no relief.  Discussed possible IT band - contributing.  PT did not help.  Being followed by Delbert Harness.  Has f/u planned.  Reports easy bruising.  Also reports problems with increased palpitations. Make occur 2x/week.  No increased heart rate.  Breathing stable.     Past Medical History:  Diagnosis Date   Carpal tunnel syndrome    bilateral   Hypertension    Obesity    Sciatica of left side 02/28/2014   Past Surgical History:  Procedure Laterality Date   arm surgery  1999   screws/plates   CARPAL TUNNEL RELEASE  2008   CESAREAN SECTION  1993 and 2001   cholescystectomy  1995   COLONOSCOPY WITH PROPOFOL N/A 07/24/2017   Procedure: COLONOSCOPY WITH PROPOFOL;  Surgeon: Midge Minium, MD;  Location: [facility];  Service: Endoscopy;  Laterality: N/A;   POLYPECTOMY  07/24/2017   Procedure: POLYPECTOMY;  Surgeon: Midge Minium, MD;  Location: [facility];  Service: Endoscopy;;   TONSILLECTOMY  1973   VAGINAL HYSTERECTOMY  2004   ovaries not removed, secondary to fibroids and bleeding   Family History  Problem Relation Age of Onset   Prostate cancer Father    Lymphoma Father    Hypertension Mother    Ulcerative colitis Mother    Colon polyps Mother    Kidney cancer Mother    Heart disease Maternal Grandmother        myocardial infarction   Colon cancer Neg Hx    Breast cancer Neg Hx    Social History    Socioeconomic History   Marital status: Married    Spouse name: Xylie Diner   Number of children: 2   Years of education: college   Highest education level: Associate degree: occupational, Scientist, product/process development, or vocational program  Occupational History   Occupation: IT trainer: SELF EMPLOYED  Tobacco Use   Smoking status: Never   Smokeless tobacco: Never  Substance and Sexual Activity   Alcohol use: No    Alcohol/week: 0.0 standard drinks of alcohol   Drug use: No   Sexual activity: Yes    Birth control/protection: Surgical  Other Topics Concern   Not on file  Social History Narrative   Not on file   Social Determinants of Health   Financial Resource Strain: Low Risk  (01/28/2023)   Overall Financial Resource Strain (CARDIA)    Difficulty of Paying Living Expenses: Not hard at all  Food Insecurity: No Food Insecurity (01/28/2023)   Hunger Vital Sign    Worried About Running Out of Food in the Last Year: Never true    Ran Out of Food in the Last Year: Never true  Transportation Needs: No Transportation Needs (01/28/2023)   PRAPARE - Administrator, Civil Service (Medical): No    Lack of Transportation (Non-Medical): No  Physical Activity: Insufficiently Active (01/28/2023)   Exercise Vital Sign    Days of Exercise per Week: 2 days    Minutes of Exercise per Session: 10 min  Stress: No Stress Concern Present (01/28/2023)   [employer] of Occupational Health - Occupational Stress Questionnaire    Feeling of Stress : Only a little  Social Connections: Socially Integrated (01/28/2023)   Social Connection and Isolation Panel [NHANES]    Frequency of Communication with Friends and Family: More than three times a week    Frequency of Social Gatherings with Friends and Family: More than three times a week    Attends Religious Services: More than 4 times per year    Active Member of [employer] or Organizations: Yes    Attends Hospital doctor: More than 4 times per year    Marital Status: Married     Review of Systems  Constitutional:  Negative for appetite change and unexpected weight change.  HENT:  Negative for congestion and sinus pressure.   Respiratory:  Negative for cough, chest tightness and shortness of breath.   Cardiovascular:  Positive for palpitations. Negative for chest pain and leg swelling.  Gastrointestinal:  Negative for abdominal pain, diarrhea, nausea and vomiting.  Genitourinary:  Negative for difficulty urinating and dysuria.  Musculoskeletal:  Negative for myalgias.       Hip/leg and knee pain as outlined.   Skin:  Negative for color change and rash.  Neurological:  Negative for dizziness and headaches.  Psychiatric/Behavioral:  Negative for agitation and dysphoric mood.        Objective:     BP 132/78   Pulse 74   Temp 98.1 F (36.7 C) (Oral)   Ht 5\' 8"  (1.727 m)   Wt (!) 309 lb 3.2 oz (140.3 kg)   SpO2 99%   BMI 47.01 kg/m  Wt Readings from Last 3 Encounters:  01/29/23 (!) 309 lb 3.2 oz (140.3 kg)  12/17/21 (!) 319 lb 10.7 oz (145 kg)  11/01/21 (!) 317 lb 6.4 oz (144 kg)    Physical Exam Vitals reviewed.  Constitutional:      General: She is not in acute distress.    Appearance: Normal appearance.  HENT:     Head: Normocephalic and atraumatic.     Right Ear: External ear normal.     Left Ear: External ear normal.  Eyes:     General: No scleral icterus.       Right eye: No discharge.        Left eye: No discharge.     Conjunctiva/sclera: Conjunctivae normal.  Neck:     Thyroid: No thyromegaly.  Cardiovascular:     Rate and Rhythm: Normal rate and regular rhythm.  Pulmonary:     Effort: No respiratory distress.     Breath sounds: Normal breath sounds. No wheezing.  Abdominal:     General: Bowel sounds are normal.     Palpations: Abdomen is soft.     Tenderness: There is no abdominal tenderness.  Musculoskeletal:        General: No swelling or tenderness.      Cervical back: Neck supple. No tenderness.  Lymphadenopathy:     Cervical: No cervical adenopathy.  Skin:    Findings: No erythema or rash.  Neurological:     Mental Status: She is alert.  Psychiatric:        Mood and Affect: Mood normal.        Behavior: Behavior normal.  Outpatient Encounter Medications as of 01/29/2023  Medication Sig   Semaglutide-Weight Management (WEGOVY) 0.25 MG/0.5ML SOAJ Inject 0.25 mg into the skin once a week.   ALPRAZolam (XANAX) 0.25 MG tablet Take 1 tablet (0.25 mg total) by mouth daily as needed for anxiety.   diclofenac (VOLTAREN) 75 MG EC tablet Take 75 mg by mouth 2 (two) times daily.   gabapentin (NEURONTIN) 300 MG capsule Take 300 mg by mouth at bedtime as needed.   ibuprofen (ADVIL,MOTRIN) 200 MG tablet Take 200 mg by mouth every 6 (six) hours as needed.   levocetirizine (XYZAL) 5 MG tablet Take 1 tablet (5 mg total) by mouth daily.   pantoprazole (PROTONIX) 40 MG tablet Take 1 tablet (40 mg total) by mouth 2 (two) times daily.   [DISCONTINUED] levocetirizine (XYZAL) 5 MG tablet TAKE 1 TABLET BY MOUTH DAILY   [DISCONTINUED] venlafaxine XR (EFFEXOR-XR) 37.5 MG 24 hr capsule TAKE 1 CAPSULE BY MOUTH DAILY WITH BREAKFAST   No facility-administered encounter medications on file as of 01/29/2023.     Lab Results  Component Value Date   WBC 11.2 (H) 11/01/2021   HGB 13.0 11/01/2021   HCT 40.3 11/01/2021   PLT 305.0 11/01/2021   GLUCOSE 70 11/01/2021   CHOL 221 (H) 11/01/2021   TRIG 166.0 (H) 11/01/2021   HDL 58.50 11/01/2021   LDLDIRECT 147.8 04/23/2012   LDLCALC 130 (H) 11/01/2021   ALT 26 11/01/2021   AST 13 11/01/2021   NA 142 11/01/2021   K 3.5 11/01/2021   CL 100 11/01/2021   CREATININE 0.82 11/01/2021   BUN 24 (H) 11/01/2021   CO2 32 11/01/2021   TSH 3.71 11/01/2021    MM 3D SCREENING MAMMOGRAM BILATERAL BREAST  Result Date: 11/17/2022 CLINICAL DATA:  Screening. EXAM: DIGITAL SCREENING BILATERAL MAMMOGRAM WITH TOMOSYNTHESIS  AND CAD TECHNIQUE: Bilateral screening digital craniocaudal and mediolateral oblique mammograms were obtained. Bilateral screening digital breast tomosynthesis was performed. The images were evaluated with computer-aided detection. COMPARISON:  Previous exam(s). ACR Breast Density Category a: The breasts are almost entirely fatty. FINDINGS: There are no findings suspicious for malignancy. IMPRESSION: No mammographic evidence of malignancy. A result letter of this screening mammogram will be mailed directly to the patient. RECOMMENDATION: Screening mammogram in one year. (Code:SM-B-01Y) BI-RADS CATEGORY  1: Negative. Electronically Signed   By: Harmon Pier M.D.   On: 11/17/2022 17:30       Assessment & Plan:  Palpitations Assessment & Plan: Described increased palpitations as outlined.  Discussed decreased caffeine intake and avoiding stimulants.  Check cbc, electrolytes and tsh.  EKG - SR with non specific ST/T changes. (TWI III).  Discussed further evaluation, including zio monitor and possible echo/referral to cardiology.  Wants to hold on further testing.  Agreed to labs.  Follow.   Orders: -     EKG 12-Lead -     CBC with Differential/Platelet; Future -     Hepatic function panel; Future -     Basic metabolic panel; Future -     TSH; Future  Anxiety Assessment & Plan: Appears to be stable.  Follow.     Hot flashes Assessment & Plan: Check FSH.    Orders: -     Follicle stimulating hormone; Future  Right hip pain -     Sedimentation rate; Future  Screening cholesterol level -     Lipid panel; Future  Environmental allergies Assessment & Plan: Continue xyzal.  Doing well on this medication.    Gastroesophageal reflux disease, unspecified  whether esophagitis present Assessment & Plan: No acid reflux reported.  Continue protonix.    Weight loss counseling, encounter for Assessment & Plan: Discussed medication options.  Discussed GLP 1 agonist as outlined.  Start wegovy  .25mg .  Follow. Keep f/u appt.    Arthralgia, unspecified joint Assessment & Plan: Joint pains as outlined.  Seeing ortho.  Has been to PT.  Check ESR.    Other orders -     Levocetirizine Dihydrochloride; Take 1 tablet (5 mg total) by mouth daily.  Dispense: 30 tablet; Refill: 3 -     Wegovy; Inject 0.25 mg into the skin once a week.  Dispense: 2 mL; Refill: 2     Dale Oracle, MD

## 2023-01-31 ENCOUNTER — Encounter: Payer: Self-pay | Admitting: Internal Medicine

## 2023-01-31 DIAGNOSIS — M2550 Pain in unspecified joint: Secondary | ICD-10-CM | POA: Insufficient documentation

## 2023-01-31 NOTE — Assessment & Plan Note (Signed)
Described increased palpitations as outlined.  Discussed decreased caffeine intake and avoiding stimulants.  Check cbc, electrolytes and tsh.  EKG - SR with non specific ST/T changes. (TWI III).  Discussed further evaluation, including zio monitor and possible echo/referral to cardiology.  Wants to hold on further testing.  Agreed to labs.  Follow.

## 2023-01-31 NOTE — Assessment & Plan Note (Signed)
Appears to be stable.  Follow.   

## 2023-01-31 NOTE — Assessment & Plan Note (Signed)
Check FSH 

## 2023-01-31 NOTE — Assessment & Plan Note (Signed)
Continue xyzal.  Doing well on this medication.

## 2023-01-31 NOTE — Assessment & Plan Note (Signed)
Joint pains as outlined.  Seeing ortho.  Has been to PT.  Check ESR.

## 2023-01-31 NOTE — Assessment & Plan Note (Signed)
No acid reflux reported.  Continue protonix.  

## 2023-01-31 NOTE — Assessment & Plan Note (Signed)
Discussed medication options.  Discussed GLP 1 agonist as outlined.  Start wegovy .25mg .  Follow. Keep f/u appt.

## 2023-02-03 ENCOUNTER — Other Ambulatory Visit (INDEPENDENT_AMBULATORY_CARE_PROVIDER_SITE_OTHER): Payer: BC Managed Care – PPO

## 2023-02-03 ENCOUNTER — Ambulatory Visit: Payer: BC Managed Care – PPO | Admitting: Internal Medicine

## 2023-02-03 DIAGNOSIS — R232 Flushing: Secondary | ICD-10-CM

## 2023-02-03 DIAGNOSIS — R002 Palpitations: Secondary | ICD-10-CM

## 2023-02-03 DIAGNOSIS — M25551 Pain in right hip: Secondary | ICD-10-CM | POA: Diagnosis not present

## 2023-02-03 DIAGNOSIS — Z13220 Encounter for screening for lipoid disorders: Secondary | ICD-10-CM

## 2023-02-03 LAB — CBC WITH DIFFERENTIAL/PLATELET
Basophils Absolute: 0.1 10*3/uL (ref 0.0–0.1)
Basophils Relative: 0.9 % (ref 0.0–3.0)
Eosinophils Absolute: 0.2 10*3/uL (ref 0.0–0.7)
Eosinophils Relative: 2.2 % (ref 0.0–5.0)
HCT: 41.5 % (ref 36.0–46.0)
Hemoglobin: 13.2 g/dL (ref 12.0–15.0)
Lymphocytes Relative: 27.6 % (ref 12.0–46.0)
Lymphs Abs: 3.1 10*3/uL (ref 0.7–4.0)
MCHC: 31.8 g/dL (ref 30.0–36.0)
MCV: 85.3 fl (ref 78.0–100.0)
Monocytes Absolute: 0.7 10*3/uL (ref 0.1–1.0)
Monocytes Relative: 6.5 % (ref 3.0–12.0)
Neutro Abs: 7 10*3/uL (ref 1.4–7.7)
Neutrophils Relative %: 62.8 % (ref 43.0–77.0)
Platelets: 326 10*3/uL (ref 150.0–400.0)
RBC: 4.87 Mil/uL (ref 3.87–5.11)
RDW: 14.6 % (ref 11.5–15.5)
WBC: 11.1 10*3/uL — ABNORMAL HIGH (ref 4.0–10.5)

## 2023-02-03 LAB — HEPATIC FUNCTION PANEL
ALT: 25 U/L (ref 0–35)
AST: 17 U/L (ref 0–37)
Albumin: 4 g/dL (ref 3.5–5.2)
Alkaline Phosphatase: 82 U/L (ref 39–117)
Bilirubin, Direct: 0.1 mg/dL (ref 0.0–0.3)
Total Bilirubin: 0.7 mg/dL (ref 0.2–1.2)
Total Protein: 6.4 g/dL (ref 6.0–8.3)

## 2023-02-03 LAB — LIPID PANEL
Cholesterol: 202 mg/dL — ABNORMAL HIGH (ref 0–200)
HDL: 51.7 mg/dL (ref >39.00)
LDL Cholesterol: 133 mg/dL — ABNORMAL HIGH (ref 0–99)
NonHDL: 150.27
Total CHOL/HDL Ratio: 4
Triglycerides: 86 mg/dL (ref 0.0–149.0)
VLDL: 17.2 mg/dL (ref 0.0–40.0)

## 2023-02-03 LAB — BASIC METABOLIC PANEL
BUN: 17 mg/dL (ref 6–23)
CO2: 29 meq/L (ref 19–32)
Calcium: 9.3 mg/dL (ref 8.4–10.5)
Chloride: 100 meq/L (ref 96–112)
Creatinine, Ser: 0.78 mg/dL (ref 0.40–1.20)
GFR: 84.82 mL/min (ref >60.00)
Glucose, Bld: 83 mg/dL (ref 70–99)
Potassium: 4.2 meq/L (ref 3.5–5.1)
Sodium: 139 meq/L (ref 135–145)

## 2023-02-03 LAB — SEDIMENTATION RATE: Sed Rate: 13 mm/h (ref 0–30)

## 2023-02-04 ENCOUNTER — Other Ambulatory Visit (HOSPITAL_COMMUNITY): Payer: Self-pay

## 2023-02-04 ENCOUNTER — Telehealth: Payer: Self-pay

## 2023-02-04 LAB — TSH: TSH: 1.99 u[IU]/mL (ref 0.35–5.50)

## 2023-02-04 LAB — FOLLICLE STIMULATING HORMONE: FSH: 62.2 m[IU]/mL

## 2023-02-04 NOTE — Telephone Encounter (Signed)
Pharmacy Patient Advocate Encounter   Received notification from CoverMyMeds that prior authorization for [facility] is required/requested.   Insurance verification completed.   The patient is insured through [facility] .   Per test claim: PA required; PA submitted to [facility] via CoverMyMeds Key/confirmation #/EOC Key: [id number]  Status is pending

## 2023-02-05 NOTE — Telephone Encounter (Signed)
Pharmacy Patient Advocate Encounter  Received notification from [facility] that Prior Authorization for [facility]  has been CANCELLED due to  ''. Please have patient,contact FORM:[phone]''

## 2023-03-16 ENCOUNTER — Encounter: Payer: Self-pay | Admitting: Internal Medicine

## 2023-03-16 ENCOUNTER — Ambulatory Visit: Payer: BC Managed Care – PPO | Admitting: Internal Medicine

## 2023-03-16 ENCOUNTER — Other Ambulatory Visit (HOSPITAL_COMMUNITY)
Admission: RE | Admit: 2023-03-16 | Discharge: 2023-03-16 | Disposition: A | Discharge status: Home / Self Care | Payer: BC Managed Care – PPO | Source: Ambulatory Visit | Attending: Internal Medicine | Admitting: Internal Medicine

## 2023-03-16 VITALS — BP 130/72 | HR 78 | Temp 98.2°F | Resp 16 | Ht 68.0 in | Wt 308.2 lb

## 2023-03-16 DIAGNOSIS — F419 Anxiety disorder, unspecified: Secondary | ICD-10-CM

## 2023-03-16 DIAGNOSIS — Z0000 Encounter for general adult medical examination without abnormal findings: Secondary | ICD-10-CM | POA: Diagnosis not present

## 2023-03-16 DIAGNOSIS — R232 Flushing: Secondary | ICD-10-CM | POA: Diagnosis not present

## 2023-03-16 DIAGNOSIS — Z124 Encounter for screening for malignant neoplasm of cervix: Secondary | ICD-10-CM | POA: Diagnosis present

## 2023-03-16 DIAGNOSIS — M25551 Pain in right hip: Secondary | ICD-10-CM

## 2023-03-16 DIAGNOSIS — K219 Gastro-esophageal reflux disease without esophagitis: Secondary | ICD-10-CM

## 2023-03-16 MED ORDER — VENLAFAXINE HCL ER 75 MG PO CP24: 75.0000 mg | ORAL_CAPSULE | Freq: Every day | ORAL | 3 refills | Status: DC

## 2023-03-16 NOTE — Progress Notes (Signed)
Subjective:    Patient ID: Jenny Stephenson, female    DOB: [birth year], 57 y.o.   MRN: [id number]  Patient here for  Chief Complaint  Patient presents with   Annual Exam    HPI Here for a physical exam. Last visit, she report increased hip, leg and knee pain. Has seen ortho. S/p injection - right hip. Initially 50% relief. Last injection - no relief. Discussed possible IT band - contributing. PT did not help. Being followed by Delbert Harness. Persistent right hip pain and pain down right thigh.  It appears has not had much improvement with above interventions.  Discussed continued f/u with ortho. Also was having problems with palpitations. No chest pain reported.  Breathing stable. Prescribed wegovy for weight loss. Insurance issues about coverage. Had easy bruising - notices on forearms.  No other bleeding.  Hot flashes - On effexor.     Past Medical History:  Diagnosis Date   Carpal tunnel syndrome    bilateral   Hypertension    Obesity    Sciatica of left side 02/28/2014   Past Surgical History:  Procedure Laterality Date   arm surgery  1999   screws/plates   CARPAL TUNNEL RELEASE  2008   CESAREAN SECTION  1993 and 2001   cholescystectomy  1995   COLONOSCOPY WITH PROPOFOL N/A 07/24/2017   Procedure: COLONOSCOPY WITH PROPOFOL;  Surgeon: Midge Minium, MD;  Location: [facility];  Service: Endoscopy;  Laterality: N/A;   POLYPECTOMY  07/24/2017   Procedure: POLYPECTOMY;  Surgeon: Midge Minium, MD;  Location: [facility];  Service: Endoscopy;;   TONSILLECTOMY  1973   VAGINAL HYSTERECTOMY  2004   ovaries not removed, secondary to fibroids and bleeding   Family History  Problem Relation Age of Onset   Prostate cancer Father    Lymphoma Father    Hypertension Mother    Ulcerative colitis Mother    Colon polyps Mother    Kidney cancer Mother    Heart disease Maternal Grandmother        myocardial infarction   Colon cancer Neg Hx    Breast cancer Neg Hx     Social History   Socioeconomic History   Marital status: Married    Spouse name: Loisann Pelegrin   Number of children: 2   Years of education: college   Highest education level: Associate degree: occupational, Scientist, product/process development, or vocational program  Occupational History   Occupation: IT trainer: SELF EMPLOYED  Tobacco Use   Smoking status: Never   Smokeless tobacco: Never  Substance and Sexual Activity   Alcohol use: No    Alcohol/week: 0.0 standard drinks of alcohol   Drug use: No   Sexual activity: Yes    Birth control/protection: Surgical  Other Topics Concern   Not on file  Social History Narrative   Not on file   Social Determinants of Health   Financial Resource Strain: Low Risk  (01/28/2023)   Overall Financial Resource Strain (CARDIA)    Difficulty of Paying Living Expenses: Not hard at all  Food Insecurity: No Food Insecurity (01/28/2023)   Hunger Vital Sign    Worried About Running Out of Food in the Last Year: Never true    Ran Out of Food in the Last Year: Never true  Transportation Needs: No Transportation Needs (01/28/2023)   PRAPARE - Administrator, Civil Service (Medical): No    Lack of Transportation (Non-Medical): No  Physical Activity: Insufficiently  Active (01/28/2023)   Exercise Vital Sign    Days of Exercise per Week: 2 days    Minutes of Exercise per Session: 10 min  Stress: No Stress Concern Present (01/28/2023)   [employer] of Occupational Health - Occupational Stress Questionnaire    Feeling of Stress : Only a little  Social Connections: Socially Integrated (01/28/2023)   Social Connection and Isolation Panel [NHANES]    Frequency of Communication with Friends and Family: More than three times a week    Frequency of Social Gatherings with Friends and Family: More than three times a week    Attends Religious Services: More than 4 times per year    Active Member of [employer] or Organizations: Yes    Attends Probation officer: More than 4 times per year    Marital Status: Married     Review of Systems  Constitutional:  Negative for appetite change and unexpected weight change.  HENT:  Negative for congestion, sinus pressure and sore throat.   Eyes:  Negative for pain and visual disturbance.  Respiratory:  Negative for cough, chest tightness and shortness of breath.   Cardiovascular:  Negative for chest pain and palpitations.  Gastrointestinal:  Negative for abdominal pain, diarrhea, nausea and vomiting.  Genitourinary:  Negative for difficulty urinating and dysuria.  Musculoskeletal:  Negative for joint swelling and myalgias.  Skin:  Negative for color change and rash.  Neurological:  Negative for dizziness and headaches.  Hematological:  Negative for adenopathy. Does not bruise/bleed easily.  Psychiatric/Behavioral:  Negative for agitation and dysphoric mood.        Objective:     BP 130/72   Pulse 78   Temp 98.2 F (36.8 C)   Resp 16   Ht 5\' 8"  (1.727 m)   Wt (!) 308 lb 3.2 oz (139.8 kg)   SpO2 98%   BMI 46.86 kg/m  Wt Readings from Last 3 Encounters:  03/16/23 (!) 308 lb 3.2 oz (139.8 kg)  01/29/23 (!) 309 lb 3.2 oz (140.3 kg)  12/17/21 (!) 319 lb 10.7 oz (145 kg)    Physical Exam Vitals reviewed.  Constitutional:      General: She is not in acute distress.    Appearance: Normal appearance. She is well-developed.  HENT:     Head: Normocephalic and atraumatic.     Right Ear: External ear normal.     Left Ear: External ear normal.  Eyes:     General: No scleral icterus.       Right eye: No discharge.        Left eye: No discharge.     Conjunctiva/sclera: Conjunctivae normal.  Neck:     Thyroid: No thyromegaly.  Cardiovascular:     Rate and Rhythm: Normal rate and regular rhythm.  Pulmonary:     Effort: No tachypnea, accessory muscle usage or respiratory distress.     Breath sounds: Normal breath sounds. No decreased breath sounds or wheezing.  Chest:   Breasts:    Right: No inverted nipple, mass, nipple discharge or tenderness (no axillary adenopathy).     Left: No inverted nipple, mass, nipple discharge or tenderness (no axilarry adenopathy).  Abdominal:     General: Bowel sounds are normal.     Palpations: Abdomen is soft.     Tenderness: There is no abdominal tenderness.  Genitourinary:    Comments: Normal external genitalia.  Vaginal vault without lesions.  Cervix identified.  Pap smear performed.  Could not appreciate any  adnexal masses or tenderness.   Musculoskeletal:        General: No swelling or tenderness.     Cervical back: Neck supple. No tenderness.  Lymphadenopathy:     Cervical: No cervical adenopathy.  Skin:    Findings: No erythema or rash.  Neurological:     Mental Status: She is alert and oriented to person, place, and time.  Psychiatric:        Mood and Affect: Mood normal.        Behavior: Behavior normal.      Outpatient Encounter Medications as of 03/16/2023  Medication Sig   venlafaxine XR (EFFEXOR XR) 75 MG 24 hr capsule Take 1 capsule (75 mg total) by mouth daily with breakfast.   ALPRAZolam (XANAX) 0.25 MG tablet Take 1 tablet (0.25 mg total) by mouth daily as needed for anxiety.   diclofenac (VOLTAREN) 75 MG EC tablet Take 75 mg by mouth 2 (two) times daily.   gabapentin (NEURONTIN) 300 MG capsule Take 300 mg by mouth at bedtime as needed.   ibuprofen (ADVIL,MOTRIN) 200 MG tablet Take 200 mg by mouth every 6 (six) hours as needed.   levocetirizine (XYZAL) 5 MG tablet Take 1 tablet (5 mg total) by mouth daily.   pantoprazole (PROTONIX) 40 MG tablet Take 1 tablet (40 mg total) by mouth 2 (two) times daily.   [DISCONTINUED] Semaglutide-Weight Management (WEGOVY) 0.25 MG/0.5ML SOAJ Inject 0.25 mg into the skin once a week.   No facility-administered encounter medications on file as of 03/16/2023.     Lab Results  Component Value Date   WBC 11.1 (H) 02/03/2023   HGB 13.2 02/03/2023   HCT 41.5  02/03/2023   PLT 326.0 02/03/2023   GLUCOSE 83 02/03/2023   CHOL 202 (H) 02/03/2023   TRIG 86.0 02/03/2023   HDL 51.70 02/03/2023   LDLDIRECT 147.8 04/23/2012   LDLCALC 133 (H) 02/03/2023   ALT 25 02/03/2023   AST 17 02/03/2023   NA 139 02/03/2023   K 4.2 02/03/2023   CL 100 02/03/2023   CREATININE 0.78 02/03/2023   BUN 17 02/03/2023   CO2 29 02/03/2023   TSH 1.99 02/03/2023    MM 3D SCREENING MAMMOGRAM BILATERAL BREAST  Result Date: 11/17/2022 CLINICAL DATA:  Screening. EXAM: DIGITAL SCREENING BILATERAL MAMMOGRAM WITH TOMOSYNTHESIS AND CAD TECHNIQUE: Bilateral screening digital craniocaudal and mediolateral oblique mammograms were obtained. Bilateral screening digital breast tomosynthesis was performed. The images were evaluated with computer-aided detection. COMPARISON:  Previous exam(s). ACR Breast Density Category a: The breasts are almost entirely fatty. FINDINGS: There are no findings suspicious for malignancy. IMPRESSION: No mammographic evidence of malignancy. A result letter of this screening mammogram will be mailed directly to the patient. RECOMMENDATION: Screening mammogram in one year. (Code:SM-B-01Y) BI-RADS CATEGORY  1: Negative. Electronically Signed   By: Harmon Pier M.D.   On: 11/17/2022 17:30       Assessment & Plan:  Routine general medical examination at a health care facility  Healthcare maintenance Assessment & Plan: Physical today 03/16/23. PAP 08/12/19 - negative with negative HPV.  Repeat pap today. Mammogram 11/14/22 - Birads I. colonoscopy 07/2017.  Recommended f/u colonoscopy in 10 years.    Cervical cancer screening -     Cytology - PAP  Right hip pain Assessment & Plan: Has seen ortho.  S/p injection.  PT did not help.  Being followed by Dewaine Conger.    Hot flashes Assessment & Plan: Effexor.  Follow.    Gastroesophageal reflux disease, unspecified  whether esophagitis present Assessment & Plan: No acid reflux reported.  Continue protonix.     Anxiety Assessment & Plan: Appears to be stable.  Follow.     Other orders -     Venlafaxine HCl ER; Take 1 capsule (75 mg total) by mouth daily with breakfast.  Dispense: 30 capsule; Refill: 3     Dale [place], MD

## 2023-03-16 NOTE — Assessment & Plan Note (Signed)
Physical today 03/16/23. PAP 08/12/19 - negative with negative HPV.  Repeat pap today. Mammogram 11/14/22 - Birads I. colonoscopy 07/2017.  Recommended f/u colonoscopy in 10 years.

## 2023-03-17 LAB — CYTOLOGY - PAP
Comment: NEGATIVE
Diagnosis: NEGATIVE
High risk HPV: NEGATIVE

## 2023-03-22 ENCOUNTER — Encounter: Payer: Self-pay | Admitting: Internal Medicine

## 2023-03-22 NOTE — Assessment & Plan Note (Signed)
Effexor.  Follow.

## 2023-03-22 NOTE — Assessment & Plan Note (Signed)
Has seen ortho.  S/p injection.  PT did not help.  Being followed by Dewaine Conger.

## 2023-03-22 NOTE — Assessment & Plan Note (Signed)
Appears to be stable.  Follow.   

## 2023-03-22 NOTE — Assessment & Plan Note (Signed)
No acid reflux reported.  Continue protonix.  

## 2023-03-28 ENCOUNTER — Encounter: Payer: Self-pay | Admitting: Internal Medicine

## 2023-03-30 NOTE — Telephone Encounter (Signed)
Patient aware of below. She does not need [place] to prescribe. They prescribed medication. This was an [place]

## 2023-03-30 NOTE — Telephone Encounter (Signed)
Please call and notify her that I am ok with prescribing zepbound.  I start with the 2.5mg  dose.  (This is the equivalent to [facility]).

## 2023-05-13 ENCOUNTER — Telehealth: Payer: Self-pay | Admitting: Internal Medicine

## 2023-05-13 ENCOUNTER — Other Ambulatory Visit: Payer: Self-pay

## 2023-05-13 DIAGNOSIS — Z13220 Encounter for screening for lipoid disorders: Secondary | ICD-10-CM

## 2023-05-13 DIAGNOSIS — E7800 Pure hypercholesterolemia, unspecified: Secondary | ICD-10-CM

## 2023-05-13 NOTE — Telephone Encounter (Signed)
Labs ordered.

## 2023-05-13 NOTE — Telephone Encounter (Signed)
Patient need lab orders.

## 2023-05-18 ENCOUNTER — Other Ambulatory Visit (INDEPENDENT_AMBULATORY_CARE_PROVIDER_SITE_OTHER): Payer: BC Managed Care – PPO

## 2023-05-18 DIAGNOSIS — E7800 Pure hypercholesterolemia, unspecified: Secondary | ICD-10-CM

## 2023-05-18 LAB — BASIC METABOLIC PANEL
BUN: 23 mg/dL (ref 6–23)
CO2: 30 meq/L (ref 19–32)
Calcium: 9.4 mg/dL (ref 8.4–10.5)
Chloride: 102 meq/L (ref 96–112)
Creatinine, Ser: 0.85 mg/dL (ref 0.40–1.20)
GFR: 76.36 mL/min (ref >60.00)
Glucose, Bld: 84 mg/dL (ref 70–99)
Potassium: 3.3 meq/L — ABNORMAL LOW (ref 3.5–5.1)
Sodium: 142 meq/L (ref 135–145)

## 2023-05-18 LAB — LIPID PANEL
Cholesterol: 232 mg/dL — ABNORMAL HIGH (ref 0–200)
HDL: 52 mg/dL (ref >39.00)
LDL Cholesterol: 149 mg/dL — ABNORMAL HIGH (ref 0–99)
NonHDL: 179.9
Total CHOL/HDL Ratio: 4
Triglycerides: 157 mg/dL — ABNORMAL HIGH (ref 0.0–149.0)
VLDL: 31.4 mg/dL (ref 0.0–40.0)

## 2023-05-18 LAB — HEPATIC FUNCTION PANEL
ALT: 26 U/L (ref 0–35)
AST: 14 U/L (ref 0–37)
Albumin: 4.1 g/dL (ref 3.5–5.2)
Alkaline Phosphatase: 91 U/L (ref 39–117)
Bilirubin, Direct: 0.1 mg/dL (ref 0.0–0.3)
Total Bilirubin: 0.6 mg/dL (ref 0.2–1.2)
Total Protein: 6.1 g/dL (ref 6.0–8.3)

## 2023-05-22 ENCOUNTER — Ambulatory Visit: Payer: BC Managed Care – PPO | Admitting: Internal Medicine

## 2023-05-22 VITALS — BP 130/78 | HR 73 | Temp 97.9°F | Resp 16 | Ht 68.0 in | Wt 289.4 lb

## 2023-05-22 DIAGNOSIS — Z713 Dietary counseling and surveillance: Secondary | ICD-10-CM

## 2023-05-22 DIAGNOSIS — R232 Flushing: Secondary | ICD-10-CM

## 2023-05-22 DIAGNOSIS — E7800 Pure hypercholesterolemia, unspecified: Secondary | ICD-10-CM | POA: Diagnosis not present

## 2023-05-22 DIAGNOSIS — E876 Hypokalemia: Secondary | ICD-10-CM

## 2023-05-22 DIAGNOSIS — M25551 Pain in right hip: Secondary | ICD-10-CM

## 2023-05-22 DIAGNOSIS — Z136 Encounter for screening for cardiovascular disorders: Secondary | ICD-10-CM

## 2023-05-22 DIAGNOSIS — K219 Gastro-esophageal reflux disease without esophagitis: Secondary | ICD-10-CM

## 2023-05-22 LAB — POTASSIUM: Potassium: 3.5 meq/L (ref 3.5–5.1)

## 2023-05-22 MED ORDER — LEVOCETIRIZINE DIHYDROCHLORIDE 5 MG PO TABS: 5.0000 mg | ORAL_TABLET | Freq: Every day | ORAL | 3 refills | Status: DC

## 2023-05-22 MED ORDER — VENLAFAXINE HCL ER 75 MG PO CP24: 75.0000 mg | ORAL_CAPSULE | Freq: Every day | ORAL | 1 refills | Status: DC

## 2023-05-22 NOTE — Progress Notes (Unsigned)
Subjective:    Patient ID: Jenny Stephenson, female    DOB: [birth year], 57 y.o.   MRN: [id number]  Patient here for  Chief Complaint  Patient presents with   Medical Management of Chronic Issues    HPI Here for a scheduled follow up.  Has been having issues with hip,leg and knee pain.  Seeing ortho Delbert Harness) - s/p injections. PT did not help. Discussed labs.  Cholesterol elevated. Had questions about this. Discussed calculated cholesterol risk and recommendation to continue diet and exercise. Also discussed calcium score.  Agreeable. No chest pain.  Breathing stable. No abdominal pain or bowel change. Has had dental work - tooth extraction recently. Had to eat soft foods. Has adjusted her diet and is losing weight. Doing well with her weight loss.  Tolerating zepbound.    Past Medical History:  Diagnosis Date   Carpal tunnel syndrome    bilateral   Hypertension    Obesity    Sciatica of left side 02/28/2014   Past Surgical History:  Procedure Laterality Date   arm surgery  1999   screws/plates   CARPAL TUNNEL RELEASE  2008   CESAREAN SECTION  1993 and 2001   cholescystectomy  1995   COLONOSCOPY WITH PROPOFOL N/A 07/24/2017   Procedure: COLONOSCOPY WITH PROPOFOL;  Surgeon: Midge Minium, MD;  Location: [facility];  Service: Endoscopy;  Laterality: N/A;   POLYPECTOMY  07/24/2017   Procedure: POLYPECTOMY;  Surgeon: Midge Minium, MD;  Location: [facility];  Service: Endoscopy;;   TONSILLECTOMY  1973   VAGINAL HYSTERECTOMY  2004   ovaries not removed, secondary to fibroids and bleeding   Family History  Problem Relation Age of Onset   Prostate cancer Father    Lymphoma Father    Hypertension Mother    Ulcerative colitis Mother    Colon polyps Mother    Kidney cancer Mother    Heart disease Maternal Grandmother        myocardial infarction   Colon cancer Neg Hx    Breast cancer Neg Hx    Social History   Socioeconomic History   Marital status:  Married    Spouse name: Fortunata Vary   Number of children: 2   Years of education: college   Highest education level: Associate degree: occupational, Scientist, product/process development, or vocational program  Occupational History   Occupation: IT trainer: SELF EMPLOYED  Tobacco Use   Smoking status: Never   Smokeless tobacco: Never  Substance and Sexual Activity   Alcohol use: No    Alcohol/week: 0.0 standard drinks of alcohol   Drug use: No   Sexual activity: Yes    Birth control/protection: Surgical  Other Topics Concern   Not on file  Social History Narrative   Not on file   Social Drivers of Health   Financial Resource Strain: Low Risk  (05/18/2023)   Overall Financial Resource Strain (CARDIA)    Difficulty of Paying Living Expenses: Not hard at all  Food Insecurity: No Food Insecurity (05/18/2023)   Hunger Vital Sign    Worried About Running Out of Food in the Last Year: Never true    Ran Out of Food in the Last Year: Never true  Transportation Needs: No Transportation Needs (05/18/2023)   PRAPARE - Administrator, Civil Service (Medical): No    Lack of Transportation (Non-Medical): No  Physical Activity: Insufficiently Active (05/18/2023)   Exercise Vital Sign    Days of Exercise  per Week: 3 days    Minutes of Exercise per Session: 20 min  Stress: No Stress Concern Present (05/18/2023)   [employer] of Occupational Health - Occupational Stress Questionnaire    Feeling of Stress : Only a little  Social Connections: Socially Integrated (05/18/2023)   Social Connection and Isolation Panel [NHANES]    Frequency of Communication with Friends and Family: More than three times a week    Frequency of Social Gatherings with Friends and Family: Twice a week    Attends Religious Services: More than 4 times per year    Active Member of [employer] or Organizations: Yes    Attends Engineer, structural: More than 4 times per year    Marital Status:  Married     Review of Systems  Constitutional:  Negative for appetite change and unexpected weight change.  HENT:  Negative for congestion and sinus pressure.   Respiratory:  Negative for cough, chest tightness and shortness of breath.   Cardiovascular:  Negative for chest pain, palpitations and leg swelling.  Gastrointestinal:  Negative for abdominal pain, diarrhea, nausea and vomiting.  Genitourinary:  Negative for difficulty urinating and dysuria.  Musculoskeletal:  Negative for myalgias.       Persistent hip/leg and knee pain as outlined.   Skin:  Negative for color change and rash.  Neurological:  Negative for dizziness and headaches.  Psychiatric/Behavioral:  Negative for agitation and dysphoric mood.        Objective:     BP 130/78   Pulse 73   Temp 97.9 F (36.6 C)   Resp 16   Ht 5\' 8"  (1.727 m)   Wt 289 lb 6.4 oz (131.3 kg)   SpO2 97%   BMI 44.00 kg/m  Wt Readings from Last 3 Encounters:  05/22/23 289 lb 6.4 oz (131.3 kg)  03/16/23 (!) 308 lb 3.2 oz (139.8 kg)  01/29/23 (!) 309 lb 3.2 oz (140.3 kg)    Physical Exam Vitals reviewed.  Constitutional:      General: She is not in acute distress.    Appearance: Normal appearance.  HENT:     Head: Normocephalic and atraumatic.     Right Ear: External ear normal.     Left Ear: External ear normal.     Mouth/Throat:     Pharynx: No oropharyngeal exudate or posterior oropharyngeal erythema.  Eyes:     General: No scleral icterus.       Right eye: No discharge.        Left eye: No discharge.     Conjunctiva/sclera: Conjunctivae normal.  Neck:     Thyroid: No thyromegaly.  Cardiovascular:     Rate and Rhythm: Normal rate and regular rhythm.  Pulmonary:     Effort: No respiratory distress.     Breath sounds: Normal breath sounds. No wheezing.  Abdominal:     General: Bowel sounds are normal.     Palpations: Abdomen is soft.     Tenderness: There is no abdominal tenderness.  Musculoskeletal:         General: No swelling or tenderness.     Cervical back: Neck supple. No tenderness.  Lymphadenopathy:     Cervical: No cervical adenopathy.  Skin:    Findings: No erythema or rash.  Neurological:     Mental Status: She is alert.  Psychiatric:        Mood and Affect: Mood normal.        Behavior: Behavior normal.  Outpatient Encounter Medications as of 05/22/2023  Medication Sig   ZEPBOUND 7.5 MG/0.5ML Pen Inject 7.5 mg into the skin once a week.   ALPRAZolam (XANAX) 0.25 MG tablet Take 1 tablet (0.25 mg total) by mouth daily as needed for anxiety.   diclofenac (VOLTAREN) 75 MG EC tablet Take 75 mg by mouth 2 (two) times daily.   gabapentin (NEURONTIN) 300 MG capsule Take 300 mg by mouth at bedtime as needed.   ibuprofen (ADVIL,MOTRIN) 200 MG tablet Take 200 mg by mouth every 6 (six) hours as needed.   levocetirizine (XYZAL) 5 MG tablet Take 1 tablet (5 mg total) by mouth daily.   pantoprazole (PROTONIX) 40 MG tablet Take 1 tablet (40 mg total) by mouth 2 (two) times daily.   venlafaxine XR (EFFEXOR XR) 75 MG 24 hr capsule Take 1 capsule (75 mg total) by mouth daily with breakfast.   [DISCONTINUED] levocetirizine (XYZAL) 5 MG tablet Take 1 tablet (5 mg total) by mouth daily.   [DISCONTINUED] venlafaxine XR (EFFEXOR XR) 75 MG 24 hr capsule Take 1 capsule (75 mg total) by mouth daily with breakfast.   No facility-administered encounter medications on file as of 05/22/2023.     Lab Results  Component Value Date   WBC 11.1 (H) 02/03/2023   HGB 13.2 02/03/2023   HCT 41.5 02/03/2023   PLT 326.0 02/03/2023   GLUCOSE 84 05/18/2023   CHOL 232 (H) 05/18/2023   TRIG 157.0 (H) 05/18/2023   HDL 52.00 05/18/2023   LDLDIRECT 147.8 04/23/2012   LDLCALC 149 (H) 05/18/2023   ALT 26 05/18/2023   AST 14 05/18/2023   NA 142 05/18/2023   K 3.5 05/22/2023   CL 102 05/18/2023   CREATININE 0.85 05/18/2023   BUN 23 05/18/2023   CO2 30 05/18/2023   TSH 1.99 02/03/2023       Assessment &  Plan:  Hypercholesterolemia Assessment & Plan: The 10-year ASCVD risk score (Arnett DK, et al., 2019) is: 2.8%   Values used to calculate the score:     Age: 45 years     Sex: Female     Is Non-Hispanic African American: No     Diabetic: No     Tobacco smoker: No     Systolic Blood Pressure: 130 mmHg     Is BP treated: No     HDL Cholesterol: 52 mg/dL     Total Cholesterol: 232 mg/dL  Discussed.  Is adjusting her diet. Losing weight. Continue diet and exercise. Check calcium score.    Hypokalemia -     Potassium  Weight loss counseling, encounter for Assessment & Plan: Doing well on zepbound. Continues to lose weight.  Watching her diet.  Limited exercise due to her knee and leg pain. Doing well. Continue zepbound. Tolerating.    Right hip pain Assessment & Plan: Has seen ortho.  S/p injection.  PT did not help.  Being followed by Dewaine Conger.    Hot flashes Assessment & Plan: Continues on effexor.  Appears to be doing better with increased dose.  Follow.   Gastroesophageal reflux disease, unspecified whether esophagitis present Assessment & Plan: No acid reflux reported.  Continue protonix.    Encounter for screening for coronary artery disease Assessment & Plan: Discussed cholesterol. Discussed calcium score.  Agreeable.   Orders: -     CT CARDIAC SCORING (SELF PAY ONLY); Future  Other orders -     Levocetirizine Dihydrochloride; Take 1 tablet (5 mg total) by mouth daily.  Dispense:  90 tablet; Refill: 3 -     Venlafaxine HCl ER; Take 1 capsule (75 mg total) by mouth daily with breakfast.  Dispense: 90 capsule; Refill: 1     Dale Deenwood, MD

## 2023-05-22 NOTE — Assessment & Plan Note (Signed)
The 10-year ASCVD risk score (Arnett DK, et al., 2019) is: 2.8%   Values used to calculate the score:     Age: 57 years     Sex: Female     Is Non-Hispanic African American: No     Diabetic: No     Tobacco smoker: No     Systolic Blood Pressure: 130 mmHg     Is BP treated: No     HDL Cholesterol: 52 mg/dL     Total Cholesterol: 232 mg/dL  Discussed.  Is adjusting her diet. Losing weight. Continue diet and exercise. Check calcium score.

## 2023-05-25 ENCOUNTER — Encounter: Payer: Self-pay | Admitting: Internal Medicine

## 2023-05-25 ENCOUNTER — Telehealth: Payer: Self-pay | Admitting: Internal Medicine

## 2023-05-25 DIAGNOSIS — Z136 Encounter for screening for cardiovascular disorders: Secondary | ICD-10-CM | POA: Insufficient documentation

## 2023-05-25 NOTE — Assessment & Plan Note (Signed)
Has seen ortho.  S/p injection.  PT did not help.  Being followed by Dewaine Conger.

## 2023-05-25 NOTE — Assessment & Plan Note (Signed)
No acid reflux reported.  Continue protonix.  

## 2023-05-25 NOTE — Telephone Encounter (Signed)
Lft pt vm to call ofc to sch CT. thanks 

## 2023-05-25 NOTE — Assessment & Plan Note (Signed)
Discussed cholesterol. Discussed calcium score.  Agreeable.

## 2023-05-25 NOTE — Assessment & Plan Note (Addendum)
Doing well on zepbound. Continues to lose weight.  Watching her diet.  Limited exercise due to her knee and leg pain. Doing well. Continue zepbound. Tolerating.

## 2023-05-25 NOTE — Assessment & Plan Note (Signed)
Continues on effexor.  Appears to be doing better with increased dose.  Follow.

## 2023-05-26 ENCOUNTER — Telehealth: Payer: Self-pay | Admitting: Internal Medicine

## 2023-05-26 NOTE — Telephone Encounter (Signed)
Lft pt vm to call ofc to sch CT. thanks 

## 2023-06-04 ENCOUNTER — Ambulatory Visit
Admission: RE | Admit: 2023-06-04 | Discharge: 2023-06-04 | Disposition: A | Discharge status: Home / Self Care | Payer: Self-pay | Source: Ambulatory Visit | Attending: Internal Medicine | Admitting: Internal Medicine

## 2023-06-04 DIAGNOSIS — Z136 Encounter for screening for cardiovascular disorders: Secondary | ICD-10-CM | POA: Insufficient documentation

## 2023-09-14 ENCOUNTER — Other Ambulatory Visit: Payer: BC Managed Care – PPO

## 2023-09-21 ENCOUNTER — Ambulatory Visit: Payer: BC Managed Care – PPO | Admitting: Internal Medicine

## 2023-10-01 ENCOUNTER — Other Ambulatory Visit: Payer: Self-pay

## 2023-10-01 ENCOUNTER — Other Ambulatory Visit (INDEPENDENT_AMBULATORY_CARE_PROVIDER_SITE_OTHER): Payer: Self-pay

## 2023-10-01 ENCOUNTER — Ambulatory Visit: Admitting: Orthopaedic Surgery

## 2023-10-01 VITALS — Ht 66.0 in | Wt 257.4 lb

## 2023-10-01 DIAGNOSIS — M5441 Lumbago with sciatica, right side: Secondary | ICD-10-CM

## 2023-10-01 DIAGNOSIS — M25551 Pain in right hip: Secondary | ICD-10-CM

## 2023-10-01 DIAGNOSIS — M1611 Unilateral primary osteoarthritis, right hip: Secondary | ICD-10-CM | POA: Diagnosis not present

## 2023-10-01 DIAGNOSIS — G8929 Other chronic pain: Secondary | ICD-10-CM | POA: Diagnosis not present

## 2023-10-01 NOTE — Progress Notes (Signed)
 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  This has gotten to where it is bone-on-bone and it is detrimentally affecting her mobility, her quality of life and her actives daily living.  She does ambulate using a cane.  This has been getting worse for 4 years now.  The last 6 months have been even worse for her.  It can be 10 out of 10 on a daily basis in terms of her pain.  She is interested in hip replacement surgery through an anterior process.  She did see one of our colleagues in town who appropriately want her to lose a little bit more weight since she had higher BMI but only performs posterior hip surgery and she would like to have her hip replaced the direct anterior approach.  I was able to review all of her past medical history and medications within epic.  She is not a diabetic.  Her BMI today is 41.55.  When I do have her lay in a supine position she has a leg length difference with her right side which is the painful arthritic side shorter than the left.  There is not a very large soft tissue envelope around her right hip so direct anterior hip surgery would be certainly acceptable given her body habitus.  When I have her sit up I can put her right hip through internal and external rotation but is incredibly stiff with limitations in motion and severe pain in the groin with motion.  Her left hip exam is entirely normal and moves smoothly.  X-rays that accompany her on the canopy system of her right hip and pelvis showed severe end-stage arthritis of the right hip with complete loss of the joint space and bone-on-bone wear.  We had a long and thorough discussion about hip replacement surgery through an anterior approach.  I discussed the risks and benefits of the surgery and what to expect from an intraoperative and postoperative standpoint.  I am comfortable with proceeding with surgery  because she has been on a weight loss journey and I still feel that she will continue to lose weight and even with a BMI of 41.55 based on her clinical exam and x-ray findings we are comfortable with proceeding with surgery.  We did discuss what to expect in the intraoperative and postoperative aspect of things in terms of the risk and benefits of surgery and what would be involved with mobility wise after surgery.  All questions and concerns were answered and addressed.  Will work on getting her on the OR schedule.

## 2023-10-14 NOTE — Progress Notes (Signed)
 Surgical Instructions   Your procedure is scheduled on Oct 29, 2023. Report to [facility] Main Entrance "A" at 7:30 A.M., then check in with the Admitting office. Any questions or running late day of surgery: call [phone]  Questions prior to your surgery date: call [phone], Monday-Friday, 8am-4pm. If you experience any cold or flu symptoms such as cough, fever, chills, shortness of breath, etc. between now and your scheduled surgery, please notify us  at the above number.     Remember:  Do not eat after midnight the night before your surgery  You may drink clear liquids until 4:30 the morning of your surgery.   Clear liquids allowed are: Water , Non-Citrus Juices (without pulp), Carbonated Beverages, Clear Tea (no milk, honey, etc.), Black Coffee Only (NO MILK, CREAM OR POWDERED CREAMER of any kind), and Gatorade.  Patient Instructions  The night before surgery:  No food after midnight. ONLY clear liquids after midnight  The day of surgery (if you do NOT have diabetes):  Drink ONE (1) Pre-Surgery Clear Ensure by 4:30 the morning of surgery. Drink in one sitting. Do not sip.  This drink was given to you during your hospital  pre-op appointment visit.  Nothing else to drink after completing the  Pre-Surgery Clear Ensure.            If you have questions, please contact your surgeon's office.   Take these medicines the morning of surgery with A SIP OF WATER   levocetirizine (XYZAL )  venlafaxine  XR (EFFEXOR  XR)          May take these medicines IF NEEDED: ALPRAZolam  (XANAX )  traMADol  (ULTRAM )   tirzepatide (ZEPBOUND) LAST DOSE:  One week prior to surgery, STOP taking any Aspirin (unless otherwise instructed by your surgeon) Aleve , Naproxen , Ibuprofen, Motrin, Advil, Goody's, BC's, all herbal medications, fish oil, and non-prescription vitamins.                     Do NOT Smoke (Tobacco/Vaping) for 24 hours prior to your procedure.  If you use a CPAP at night, you  may bring your mask/headgear for your overnight stay.   You will be asked to remove any contacts, glasses, piercing's, hearing aid's, dentures/partials prior to surgery. Please bring cases for these items if needed.    Patients discharged the day of surgery will not be allowed to drive home, and someone needs to stay with them for 24 hours.  SURGICAL WAITING ROOM VISITATION Patients may have no more than 2 support people in the waiting area - these visitors may rotate.   Pre-op nurse will coordinate an appropriate time for 1 ADULT support person, who may not rotate, to accompany patient in pre-op.  Children under the age of 33 must have an adult with them who is not the patient and must remain in the main waiting area with an adult.  If the patient needs to stay at the hospital during part of their recovery, the visitor guidelines for inpatient rooms apply.  Please refer to the [facility] website for the visitor guidelines for any additional information.   If you received a COVID test during your pre-op visit  it is requested that you wear a mask when out in public, stay away from anyone that may not be feeling well and notify your surgeon if you develop symptoms. If you have been in contact with anyone that has tested positive in the last 10 days please notify you surgeon.      Pre-operative  5 CHG Bathing Instructions   You can play a key role in reducing the risk of infection after surgery. Your skin needs to be as free of germs as possible. You can reduce the number of germs on your skin by washing with CHG (chlorhexidine gluconate) soap before surgery. CHG is an antiseptic soap that kills germs and continues to kill germs even after washing.   DO NOT use if you have an allergy to chlorhexidine/CHG or antibacterial soaps. If your skin becomes reddened or irritated, stop using the CHG and notify one of our RNs at [phone].   Please shower with the CHG soap starting 4 days before  surgery using the following schedule:     Please keep in mind the following:  DO NOT shave, including legs and underarms, starting the day of your first shower.   You may shave your face at any point before/day of surgery.  Place clean sheets on your bed the day you start using CHG soap. Use a clean washcloth (not used since being washed) for each shower. DO NOT sleep with pets once you start using the CHG.   CHG Shower Instructions:  Wash your face and private area with normal soap. If you choose to wash your hair, wash first with your normal shampoo.  After you use shampoo/soap, rinse your hair and body thoroughly to remove shampoo/soap residue.  Turn the water  OFF and apply about 3 tablespoons (45 ml) of CHG soap to a CLEAN washcloth.  Apply CHG soap ONLY FROM YOUR NECK DOWN TO YOUR TOES (washing for 3-5 minutes)  DO NOT use CHG soap on face, private areas, open wounds, or sores.  Pay special attention to the area where your surgery is being performed.  If you are having back surgery, having someone wash your back for you may be helpful. Wait 2 minutes after CHG soap is applied, then you may rinse off the CHG soap.  Pat dry with a clean towel  Put on clean clothes/pajamas   If you choose to wear lotion, please use ONLY the CHG-compatible lotions that are listed below.  Additional instructions for the day of surgery: DO NOT APPLY any lotions, deodorants, cologne, or perfumes.   Do not bring valuables to the hospital. [facility] is not responsible for any belongings/valuables. Do not wear nail polish, gel polish, artificial nails, or any other type of covering on natural nails (fingers and toes) Do not wear jewelry or makeup Put on clean/comfortable clothes.  Please brush your teeth.  Ask your nurse before applying any prescription medications to the skin.     CHG Compatible Lotions   Aveeno Moisturizing lotion  Cetaphil Moisturizing Cream  Cetaphil Moisturizing Lotion   Clairol Herbal Essence Moisturizing Lotion, Dry Skin  Clairol Herbal Essence Moisturizing Lotion, Extra Dry Skin  Clairol Herbal Essence Moisturizing Lotion, Normal Skin  Curel Age Defying Therapeutic Moisturizing Lotion with Alpha Hydroxy  Curel Extreme Care Body Lotion  Curel Soothing Hands Moisturizing Hand Lotion  Curel Therapeutic Moisturizing Cream, Fragrance-Free  Curel Therapeutic Moisturizing Lotion, Fragrance-Free  Curel Therapeutic Moisturizing Lotion, Original Formula  Eucerin Daily Replenishing Lotion  Eucerin Dry Skin Therapy Plus Alpha Hydroxy Crme  Eucerin Dry Skin Therapy Plus Alpha Hydroxy Lotion  Eucerin Original Crme  Eucerin Original Lotion  Eucerin Plus Crme Eucerin Plus Lotion  Eucerin TriLipid Replenishing Lotion  Keri Anti-Bacterial Hand Lotion  Keri Deep Conditioning Original Lotion Dry Skin Formula Softly Scented  Keri Deep Conditioning Original Lotion, Fragrance Free Sensitive Skin  Formula  Keri Lotion Fast Absorbing Fragrance Free Sensitive Skin Formula  Keri Lotion Fast Absorbing Softly Scented Dry Skin Formula  Keri Original Lotion  Keri Skin Renewal Lotion Keri Silky Smooth Lotion  Keri Silky Smooth Sensitive Skin Lotion  Nivea Body Creamy Conditioning Oil  Nivea Body Extra Enriched Lotion  Nivea Body Original Lotion  Nivea Body Sheer Moisturizing Lotion Nivea Crme  Nivea Skin Firming Lotion  NutraDerm 30 Skin Lotion  NutraDerm Skin Lotion  NutraDerm Therapeutic Skin Cream  NutraDerm Therapeutic Skin Lotion  ProShield Protective Hand Cream  Provon moisturizing lotion  Please read over the following fact sheets that you were given.

## 2023-10-15 ENCOUNTER — Encounter (HOSPITAL_COMMUNITY): Payer: Self-pay

## 2023-10-15 ENCOUNTER — Encounter (HOSPITAL_COMMUNITY)
Admission: RE | Admit: 2023-10-15 | Discharge: 2023-10-15 | Disposition: A | Discharge status: Home / Self Care | Source: Ambulatory Visit | Attending: Orthopaedic Surgery | Admitting: Orthopaedic Surgery

## 2023-10-15 ENCOUNTER — Other Ambulatory Visit: Payer: Self-pay

## 2023-10-15 VITALS — BP 135/84 | HR 65 | Temp 97.8°F | Resp 18 | Ht 66.0 in | Wt 254.0 lb

## 2023-10-15 DIAGNOSIS — M1611 Unilateral primary osteoarthritis, right hip: Secondary | ICD-10-CM | POA: Insufficient documentation

## 2023-10-15 DIAGNOSIS — Z01818 Encounter for other preprocedural examination: Secondary | ICD-10-CM | POA: Diagnosis present

## 2023-10-15 DIAGNOSIS — Z01812 Encounter for preprocedural laboratory examination: Secondary | ICD-10-CM | POA: Diagnosis not present

## 2023-10-15 HISTORY — DX: Nausea with vomiting, unspecified: R11.2

## 2023-10-15 HISTORY — DX: Nausea with vomiting, unspecified: Z98.890

## 2023-10-15 HISTORY — DX: Unspecified osteoarthritis, unspecified site: M19.90

## 2023-10-15 HISTORY — DX: Anxiety disorder, unspecified: F41.9

## 2023-10-15 LAB — CBC
HCT: 43.1 % (ref 36.0–46.0)
Hemoglobin: 13.6 g/dL (ref 12.0–15.0)
MCH: 27.9 pg (ref 26.0–34.0)
MCHC: 31.6 g/dL (ref 30.0–36.0)
MCV: 88.5 fL (ref 80.0–100.0)
Platelets: 343 10*3/uL (ref 150–400)
RBC: 4.87 MIL/uL (ref 3.87–5.11)
RDW: 13.9 % (ref 11.5–15.5)
WBC: 13.1 10*3/uL — ABNORMAL HIGH (ref 4.0–10.5)
nRBC: 0 % (ref 0.0–0.2)

## 2023-10-15 LAB — TYPE AND SCREEN
ABO/RH(D): O POS
Antibody Screen: NEGATIVE

## 2023-10-15 LAB — BASIC METABOLIC PANEL WITH GFR
Anion gap: 8 (ref 5–15)
BUN: 22 mg/dL — ABNORMAL HIGH (ref 6–20)
CO2: 30 mmol/L (ref 22–32)
Calcium: 9.6 mg/dL (ref 8.9–10.3)
Chloride: 102 mmol/L (ref 98–111)
Creatinine, Ser: 0.75 mg/dL (ref 0.44–1.00)
GFR, Estimated: >60 mL/min (ref >60)
Glucose, Bld: 95 mg/dL (ref 70–99)
Potassium: 4 mmol/L (ref 3.5–5.1)
Sodium: 140 mmol/L (ref 135–145)

## 2023-10-15 LAB — SURGICAL PCR SCREEN
MRSA, PCR: NEGATIVE
Staphylococcus aureus: NEGATIVE

## 2023-10-15 NOTE — Progress Notes (Signed)
 PCP - Charlene Scott,MD Cardiologist - denies  PPM/ICD - denies Device Orders -  Rep Notified -   Chest x-ray - na EKG - 01/29/23 Stress Test - denies ECHO - denies Cardiac Cath - denies  Sleep Study - denies CPAP -   Fasting Blood Sugar - na Checks Blood Sugar _____ times a day  Last dose of GLP1 agonist-  5/14 GLP1 instructions: take last dose on or before 5/21.  Blood Thinner Instructions:na Aspirin Instructions:na  ERAS Protcol - clears until 0430 PRE-SURGERY Ensure or G2- Ensure  COVID TEST- na   Anesthesia review: no  Patient denies shortness of breath, fever, cough and chest pain at PAT appointment   All instructions explained to the patient, with a verbal understanding of the material. Patient agrees to go over the instructions while at home for a better understanding.  The opportunity to ask questions was provided.

## 2023-10-15 NOTE — Progress Notes (Addendum)
 Surgical Instructions   Your procedure is scheduled on Oct 29, 2023. Report to [facility] Main Entrance "A" at 5:30 A.M., then check in with the Admitting office. Any questions or running late day of surgery: call [phone]  Questions prior to your surgery date: call [phone], Monday-Friday, 8am-4pm. If you experience any cold or flu symptoms such as cough, fever, chills, shortness of breath, etc. between now and your scheduled surgery, please notify us  at the above number.     Remember:  Do not eat after midnight the night before your surgery  You may drink clear liquids until 4:30 the morning of your surgery.   Clear liquids allowed are: Water , Non-Citrus Juices (without pulp), Carbonated Beverages, Clear Tea (no milk, honey, etc.), Black Coffee Only (NO MILK, CREAM OR POWDERED CREAMER of any kind), and Gatorade.  Patient Instructions  The night before surgery:  No food after midnight. ONLY clear liquids after midnight  The day of surgery (if you do NOT have diabetes):  Drink ONE (1) Pre-Surgery Clear Ensure by 4:30 the morning of surgery. Drink in one sitting. Do not sip.  This drink was given to you during your hospital  pre-op appointment visit.  Nothing else to drink after completing the  Pre-Surgery Clear Ensure.            If you have questions, please contact your surgeon's office.   Take these medicines the morning of surgery with A SIP OF WATER   levocetirizine (XYZAL )  venlafaxine  XR (EFFEXOR  XR)          May take these medicines IF NEEDED: ALPRAZolam  (XANAX )  traMADol  (ULTRAM )   HOLD tirzepatide (ZEPBOUND) 7 DAYS PRIOR TO SURGERY. LAST DOSE SHOULD BE ON OR BEFORE MAY 21.  One week prior to surgery, STOP taking any Aspirin (unless otherwise instructed by your surgeon) Aleve , Naproxen , Ibuprofen, Motrin, Advil, Goody's, BC's, all herbal medications, fish oil, and non-prescription vitamins.                     Do NOT Smoke (Tobacco/Vaping) for 24 hours  prior to your procedure.  If you use a CPAP at night, you may bring your mask/headgear for your overnight stay.   You will be asked to remove any contacts, glasses, piercing's, hearing aid's, dentures/partials prior to surgery. Please bring cases for these items if needed.    Patients discharged the day of surgery will not be allowed to drive home, and someone needs to stay with them for 24 hours.  SURGICAL WAITING ROOM VISITATION Patients may have no more than 2 support people in the waiting area - these visitors may rotate.   Pre-op nurse will coordinate an appropriate time for 1 ADULT support person, who may not rotate, to accompany patient in pre-op.  Children under the age of 34 must have an adult with them who is not the patient and must remain in the main waiting area with an adult.  If the patient needs to stay at the hospital during part of their recovery, the visitor guidelines for inpatient rooms apply.  Please refer to the [facility] website for the visitor guidelines for any additional information.   If you received a COVID test during your pre-op visit  it is requested that you wear a mask when out in public, stay away from anyone that may not be feeling well and notify your surgeon if you develop symptoms. If you have been in contact with anyone that has tested positive in the  last 10 days please notify you surgeon.      Pre-operative 5 CHG Bathing Instructions   You can play a key role in reducing the risk of infection after surgery. Your skin needs to be as free of germs as possible. You can reduce the number of germs on your skin by washing with CHG (chlorhexidine gluconate) soap before surgery. CHG is an antiseptic soap that kills germs and continues to kill germs even after washing.   DO NOT use if you have an allergy to chlorhexidine/CHG or antibacterial soaps. If your skin becomes reddened or irritated, stop using the CHG and notify one of our RNs at [phone].    Please shower with the CHG soap starting 4 days before surgery using the following schedule:     Please keep in mind the following:  DO NOT shave, including legs and underarms, starting the day of your first shower.   You may shave your face at any point before/day of surgery.  Place clean sheets on your bed the day you start using CHG soap. Use a clean washcloth (not used since being washed) for each shower. DO NOT sleep with pets once you start using the CHG.   CHG Shower Instructions:  Wash your face and private area with normal soap. If you choose to wash your hair, wash first with your normal shampoo.  After you use shampoo/soap, rinse your hair and body thoroughly to remove shampoo/soap residue.  Turn the water  OFF and apply about 3 tablespoons (45 ml) of CHG soap to a CLEAN washcloth.  Apply CHG soap ONLY FROM YOUR NECK DOWN TO YOUR TOES (washing for 3-5 minutes)  DO NOT use CHG soap on face, private areas, open wounds, or sores.  Pay special attention to the area where your surgery is being performed.  If you are having back surgery, having someone wash your back for you may be helpful. Wait 2 minutes after CHG soap is applied, then you may rinse off the CHG soap.  Pat dry with a clean towel  Put on clean clothes/pajamas   If you choose to wear lotion, please use ONLY the CHG-compatible lotions that are listed below.  Additional instructions for the day of surgery: DO NOT APPLY any lotions, deodorants, cologne, or perfumes.   Do not bring valuables to the hospital. [facility] is not responsible for any belongings/valuables. Do not wear nail polish, gel polish, artificial nails, or any other type of covering on natural nails (fingers and toes) Do not wear jewelry or makeup Put on clean/comfortable clothes.  Please brush your teeth.  Ask your nurse before applying any prescription medications to the skin.     CHG Compatible Lotions   Aveeno Moisturizing lotion   Cetaphil Moisturizing Cream  Cetaphil Moisturizing Lotion  Clairol Herbal Essence Moisturizing Lotion, Dry Skin  Clairol Herbal Essence Moisturizing Lotion, Extra Dry Skin  Clairol Herbal Essence Moisturizing Lotion, Normal Skin  Curel Age Defying Therapeutic Moisturizing Lotion with Alpha Hydroxy  Curel Extreme Care Body Lotion  Curel Soothing Hands Moisturizing Hand Lotion  Curel Therapeutic Moisturizing Cream, Fragrance-Free  Curel Therapeutic Moisturizing Lotion, Fragrance-Free  Curel Therapeutic Moisturizing Lotion, Original Formula  Eucerin Daily Replenishing Lotion  Eucerin Dry Skin Therapy Plus Alpha Hydroxy Crme  Eucerin Dry Skin Therapy Plus Alpha Hydroxy Lotion  Eucerin Original Crme  Eucerin Original Lotion  Eucerin Plus Crme Eucerin Plus Lotion  Eucerin TriLipid Replenishing Lotion  Keri Anti-Bacterial Hand Lotion  Keri Deep Conditioning Original Lotion Dry Skin  Formula Softly Scented  Keri Deep Conditioning Original Lotion, Fragrance Free Sensitive Skin Formula  Keri Lotion Fast Absorbing Fragrance Free Sensitive Skin Formula  Keri Lotion Fast Absorbing Softly Scented Dry Skin Formula  Keri Original Lotion  Keri Skin Renewal Lotion Keri Silky Smooth Lotion  Keri Silky Smooth Sensitive Skin Lotion  Nivea Body Creamy Conditioning Oil  Nivea Body Extra Enriched Lotion  Nivea Body Original Lotion  Nivea Body Sheer Moisturizing Lotion Nivea Crme  Nivea Skin Firming Lotion  NutraDerm 30 Skin Lotion  NutraDerm Skin Lotion  NutraDerm Therapeutic Skin Cream  NutraDerm Therapeutic Skin Lotion  ProShield Protective Hand Cream  Provon moisturizing lotion  Please read over the following fact sheets that you were given.

## 2023-10-28 NOTE — H&P (Signed)
 TOTAL HIP ADMISSION H&P  Patient is admitted for right total hip arthroplasty.  Subjective:  Chief Complaint: right hip pain  HPI: Jenny Stephenson, 58 y.o. female, has a history of pain and functional disability in the right hip(s) due to arthritis and patient has failed non-surgical conservative treatments for greater than 12 weeks to include NSAID's and/or analgesics, use of assistive devices, weight reduction as appropriate, and activity modification.  Onset of symptoms was gradual starting a little while ago with gradually worsening course since that time.The patient noted no past surgery on the right hip(s).  Patient currently rates pain in the right hip at 10 out of 10 with activity. Patient has night pain, worsening of pain with activity and weight bearing, trendelenberg gait, pain that interfers with activities of daily living, and pain with passive range of motion. Patient has evidence of subchondral sclerosis, periarticular osteophytes, and joint space narrowing by imaging studies. This condition presents safety issues increasing the risk of falls.  There is no current active infection.  Patient Active Problem List   Diagnosis Date Noted   Unilateral primary osteoarthritis, right hip 10/01/2023   Encounter for screening for coronary artery disease 05/25/2023   Joint pain 01/31/2023   Palpitations 01/29/2023   Right hip pain 01/29/2023   Osteoarthritis of knee 12/31/2022   Adiposity 12/31/2022   Right low back pain 11/02/2021   Environmental allergies 10/28/2020   Left leg swelling 09/06/2020   Cellulitis 09/06/2020   Weight loss counseling, encounter for    Polyp of sigmoid colon    Hot flashes 01/31/2017   Healthcare maintenance 01/03/2016   GERD (gastroesophageal reflux disease) 12/16/2015   Wheezing 12/03/2015   Acute suppurative otitis media of both ears without spontaneous rupture of tympanic membranes 06/06/2014   Benign paroxysmal vertigo of right ear 04/26/2014    Tinnitus 04/26/2014   Anxiety 02/28/2014   Hypercholesterolemia 06/07/2012   Past Medical History:  Diagnosis Date   Anxiety    Arthritis    Carpal tunnel syndrome    bilateral   Hypertension    Obesity    PONV (postoperative nausea and vomiting)    Sciatica of left side 02/28/2014    Past Surgical History:  Procedure Laterality Date   arm surgery  06/02/1997   screws/plates   CARPAL TUNNEL RELEASE Right 06/02/2006   CESAREAN SECTION  1993 and 2001   cholescystectomy  06/02/1993   COLONOSCOPY WITH PROPOFOL  N/A 07/24/2017   Procedure: COLONOSCOPY WITH PROPOFOL ;  Surgeon: Marnee Sink, MD;  Location: [facility];  Service: Endoscopy;  Laterality: N/A;   FRACTURE SURGERY Left    arm   POLYPECTOMY  07/24/2017   Procedure: POLYPECTOMY;  Surgeon: Marnee Sink, MD;  Location: [facility];  Service: Endoscopy;;   TONSILLECTOMY  06/03/1971   VAGINAL HYSTERECTOMY  06/02/2002   ovaries not removed, secondary to fibroids and bleeding    No current facility-administered medications for this encounter.   Current Outpatient Medications  Medication Sig Dispense Refill Last Dose/Taking   ALPRAZolam  (XANAX ) 0.25 MG tablet Take 1 tablet (0.25 mg total) by mouth daily as needed for anxiety. 30 tablet 0 Taking As Needed   cyclobenzaprine  (FLEXERIL ) 5 MG tablet Take 5 mg by mouth at bedtime as needed.   Taking As Needed   gabapentin  (NEURONTIN ) 300 MG capsule Take 300 mg by mouth at bedtime as needed (pain).   Taking As Needed   ibuprofen (ADVIL,MOTRIN) 200 MG tablet Take 800 mg by mouth every 6 (six) hours  as needed for moderate pain (pain score 4-6).   Taking As Needed   levocetirizine (XYZAL ) 5 MG tablet Take 1 tablet (5 mg total) by mouth daily. 90 tablet 3 Taking   tirzepatide (ZEPBOUND) 10 MG/0.5ML Pen Inject 10 mg into the skin once a week.   Taking   traMADol  (ULTRAM ) 50 MG tablet Take 50 mg by mouth every 8 (eight) hours as needed for moderate pain (pain score 4-6).    Taking As Needed   venlafaxine  XR (EFFEXOR  XR) 75 MG 24 hr capsule Take 1 capsule (75 mg total) by mouth daily with breakfast. 90 capsule 1 Taking   pantoprazole  (PROTONIX ) 40 MG tablet Take 1 tablet (40 mg total) by mouth 2 (two) times daily. (Patient not taking: Reported on 10/13/2023) 180 tablet 1 Not Taking   Allergies  Allergen Reactions   Tramadol  Itching    Other Reaction(s): insomnia, Other (See Comments)  Pt can now tolerate this medication   Codeine Itching and Other (See Comments)    Hyper     Social History   Tobacco Use   Smoking status: Never   Smokeless tobacco: Never  Substance Use Topics   Alcohol use: No    Alcohol/week: 0.0 standard drinks of alcohol    Family History  Problem Relation Age of Onset   Prostate cancer Father    Lymphoma Father    Hypertension Mother    Ulcerative colitis Mother    Colon polyps Mother    Kidney cancer Mother    Heart disease Maternal Grandmother        myocardial infarction   Colon cancer Neg Hx    Breast cancer Neg Hx      Review of Systems  Objective:  Physical Exam Vitals reviewed.  Constitutional:      Appearance: Normal appearance. She is obese.  HENT:     Head: Normocephalic and atraumatic.  Eyes:     Extraocular Movements: Extraocular movements intact.     Pupils: Pupils are equal, round, and reactive to light.  Cardiovascular:     Rate and Rhythm: Normal rate and regular rhythm.  Pulmonary:     Effort: Pulmonary effort is normal.     Breath sounds: Normal breath sounds.  Abdominal:     Palpations: Abdomen is soft.  Musculoskeletal:     Cervical back: Normal range of motion and neck supple.     Right hip: Tenderness and bony tenderness present. Decreased range of motion. Decreased strength.  Neurological:     Mental Status: She is alert and oriented to person, place, and time.  Psychiatric:        Behavior: Behavior normal.     Vital signs in last 24 hours:    Labs:   Estimated body mass  index is 41 kg/m as calculated from the following:   Height as of 10/15/23: 5\' 6"  (1.676 m).   Weight as of 10/15/23: 115.2 kg.   Imaging Review Plain radiographs demonstrate severe degenerative joint disease of the right hip(s). The bone quality appears to be excellent for age and reported activity level.      Assessment/Plan:  End stage arthritis, right hip(s)  The patient history, physical examination, clinical judgement of the provider and imaging studies are consistent with end stage degenerative joint disease of the right hip(s) and total hip arthroplasty is deemed medically necessary. The treatment options including medical management, injection therapy, arthroscopy and arthroplasty were discussed at length. The risks and benefits of total hip arthroplasty were  presented and reviewed. The risks due to aseptic loosening, infection, stiffness, dislocation/subluxation,  thromboembolic complications and other imponderables were discussed.  The patient acknowledged the explanation, agreed to proceed with the plan and consent was signed. Patient is being admitted for inpatient treatment for surgery, pain control, PT, OT, prophylactic antibiotics, VTE prophylaxis, progressive ambulation and ADL's and discharge planning.The patient is planning to be discharged home with home health services

## 2023-10-29 ENCOUNTER — Encounter (HOSPITAL_COMMUNITY)
Admission: RE | Disposition: A | Discharge status: Home / Self Care | Payer: Self-pay | Source: Home / Self Care | Attending: Orthopaedic Surgery

## 2023-10-29 ENCOUNTER — Encounter (HOSPITAL_COMMUNITY): Payer: Self-pay | Admitting: Orthopaedic Surgery

## 2023-10-29 ENCOUNTER — Ambulatory Visit (HOSPITAL_COMMUNITY): Payer: Self-pay

## 2023-10-29 ENCOUNTER — Other Ambulatory Visit: Payer: Self-pay

## 2023-10-29 ENCOUNTER — Ambulatory Visit (HOSPITAL_COMMUNITY)

## 2023-10-29 ENCOUNTER — Observation Stay (HOSPITAL_COMMUNITY)
Admission: RE | Admit: 2023-10-29 | Discharge: 2023-10-30 | Disposition: A | Discharge status: Home / Self Care | Attending: Orthopaedic Surgery | Admitting: Orthopaedic Surgery

## 2023-10-29 ENCOUNTER — Observation Stay (HOSPITAL_COMMUNITY)

## 2023-10-29 DIAGNOSIS — Z79899 Other long term (current) drug therapy: Secondary | ICD-10-CM | POA: Insufficient documentation

## 2023-10-29 DIAGNOSIS — M1611 Unilateral primary osteoarthritis, right hip: Secondary | ICD-10-CM | POA: Diagnosis present

## 2023-10-29 DIAGNOSIS — I10 Essential (primary) hypertension: Secondary | ICD-10-CM | POA: Diagnosis not present

## 2023-10-29 DIAGNOSIS — Z96641 Presence of right artificial hip joint: Secondary | ICD-10-CM

## 2023-10-29 HISTORY — PX: TOTAL HIP ARTHROPLASTY: SHX124

## 2023-10-29 LAB — ABO/RH: ABO/RH(D): O POS

## 2023-10-29 SURGERY — ARTHROPLASTY, HIP, TOTAL, ANTERIOR APPROACH
Anesthesia: Monitor Anesthesia Care | Site: Hip | Laterality: Right

## 2023-10-29 MED ORDER — ACETAMINOPHEN 10 MG/ML IV SOLN
INTRAVENOUS | Status: AC
  Filled 2023-10-29: qty 100

## 2023-10-29 MED ORDER — ONDANSETRON HCL 4 MG/2ML IJ SOLN
INTRAMUSCULAR | Status: DC | PRN
  Administered 2023-10-29: 4 mg via INTRAVENOUS

## 2023-10-29 MED ORDER — MIDAZOLAM HCL 2 MG/2ML IJ SOLN
INTRAMUSCULAR | Status: AC
  Filled 2023-10-29: qty 2

## 2023-10-29 MED ORDER — ACETAMINOPHEN 325 MG PO TABS: 325.0000 mg | ORAL_TABLET | Freq: Four times a day (QID) | ORAL | Status: DC | PRN

## 2023-10-29 MED ORDER — CEFAZOLIN SODIUM-DEXTROSE 2-4 GM/100ML-% IV SOLN
2.0000 g | Freq: Four times a day (QID) | INTRAVENOUS | Status: AC
  Administered 2023-10-29 (×2): 2 g via INTRAVENOUS
  Filled 2023-10-29 (×2): qty 100

## 2023-10-29 MED ORDER — PROPOFOL 500 MG/50ML IV EMUL
INTRAVENOUS | Status: DC | PRN
  Administered 2023-10-29: 80 ug/kg/min via INTRAVENOUS

## 2023-10-29 MED ORDER — FENTANYL CITRATE (PF) 100 MCG/2ML IJ SOLN
INTRAMUSCULAR | Status: AC
  Filled 2023-10-29: qty 2

## 2023-10-29 MED ORDER — PANTOPRAZOLE SODIUM 40 MG PO TBEC
40.0000 mg | DELAYED_RELEASE_TABLET | Freq: Every day | ORAL | Status: DC
Start: 2023-10-29 — End: 2023-10-30
  Administered 2023-10-29 – 2023-10-30 (×2): 40 mg via ORAL
  Filled 2023-10-29 (×2): qty 1

## 2023-10-29 MED ORDER — METOCLOPRAMIDE HCL 5 MG/ML IJ SOLN: 5.0000 mg | Freq: Three times a day (TID) | INTRAMUSCULAR | Status: DC | PRN

## 2023-10-29 MED ORDER — METHOCARBAMOL 500 MG PO TABS
500.0000 mg | ORAL_TABLET | Freq: Four times a day (QID) | ORAL | Status: DC | PRN
  Administered 2023-10-29 – 2023-10-30 (×3): 500 mg via ORAL
  Filled 2023-10-29 (×3): qty 1

## 2023-10-29 MED ORDER — PROPOFOL 10 MG/ML IV BOLUS
INTRAVENOUS | Status: AC
  Filled 2023-10-29: qty 20

## 2023-10-29 MED ORDER — OXYCODONE HCL 5 MG PO TABS: 5.0000 mg | ORAL_TABLET | Freq: Once | ORAL | Status: DC | PRN

## 2023-10-29 MED ORDER — DIPHENHYDRAMINE HCL 12.5 MG/5ML PO ELIX: 12.5000 mg | ORAL_SOLUTION | ORAL | Status: DC | PRN

## 2023-10-29 MED ORDER — ASPIRIN 81 MG PO CHEW
81.0000 mg | CHEWABLE_TABLET | Freq: Two times a day (BID) | ORAL | Status: DC
Start: 2023-10-29 — End: 2023-10-30
  Administered 2023-10-29 – 2023-10-30 (×2): 81 mg via ORAL
  Filled 2023-10-29 (×2): qty 1

## 2023-10-29 MED ORDER — AMISULPRIDE (ANTIEMETIC) 5 MG/2ML IV SOLN
INTRAVENOUS | Status: AC
  Filled 2023-10-29: qty 4

## 2023-10-29 MED ORDER — METHOCARBAMOL 1000 MG/10ML IJ SOLN: 500.0000 mg | Freq: Four times a day (QID) | INTRAMUSCULAR | Status: DC | PRN

## 2023-10-29 MED ORDER — ACETAMINOPHEN 10 MG/ML IV SOLN
INTRAVENOUS | Status: DC | PRN
  Administered 2023-10-29: 1000 mg via INTRAVENOUS

## 2023-10-29 MED ORDER — ORAL CARE MOUTH RINSE: 15.0000 mL | Freq: Once | OROMUCOSAL | Status: AC

## 2023-10-29 MED ORDER — FENTANYL CITRATE (PF) 100 MCG/2ML IJ SOLN
25.0000 ug | INTRAMUSCULAR | Status: DC | PRN
  Administered 2023-10-29: 50 ug via INTRAVENOUS

## 2023-10-29 MED ORDER — TRANEXAMIC ACID-NACL 1000-0.7 MG/100ML-% IV SOLN
1000.0000 mg | INTRAVENOUS | Status: AC
  Administered 2023-10-29: 1000 mg via INTRAVENOUS
  Filled 2023-10-29: qty 100

## 2023-10-29 MED ORDER — OXYCODONE HCL 5 MG/5ML PO SOLN: 5.0000 mg | Freq: Once | ORAL | Status: DC | PRN

## 2023-10-29 MED ORDER — ALUM & MAG HYDROXIDE-SIMETH 200-200-20 MG/5ML PO SUSP: 30.0000 mL | ORAL | Status: DC | PRN

## 2023-10-29 MED ORDER — GLYCOPYRROLATE PF 0.2 MG/ML IJ SOSY
PREFILLED_SYRINGE | INTRAMUSCULAR | Status: DC | PRN
  Administered 2023-10-29: .2 mg via INTRAVENOUS

## 2023-10-29 MED ORDER — GABAPENTIN 300 MG PO CAPS
300.0000 mg | ORAL_CAPSULE | Freq: Every evening | ORAL | Status: DC | PRN
  Administered 2023-10-29: 300 mg via ORAL
  Filled 2023-10-29: qty 1

## 2023-10-29 MED ORDER — LACTATED RINGERS IV SOLN: INTRAVENOUS | Status: DC | PRN

## 2023-10-29 MED ORDER — OXYCODONE HCL 5 MG PO TABS
5.0000 mg | ORAL_TABLET | ORAL | Status: DC | PRN
  Administered 2023-10-29 – 2023-10-30 (×4): 10 mg via ORAL
  Filled 2023-10-29 (×5): qty 2

## 2023-10-29 MED ORDER — ACETAMINOPHEN 10 MG/ML IV SOLN: 1000.0000 mg | Freq: Once | INTRAVENOUS | Status: DC | PRN

## 2023-10-29 MED ORDER — MENTHOL 3 MG MT LOZG: 1.0000 | LOZENGE | OROMUCOSAL | Status: DC | PRN

## 2023-10-29 MED ORDER — METOCLOPRAMIDE HCL 5 MG PO TABS: 5.0000 mg | ORAL_TABLET | Freq: Three times a day (TID) | ORAL | Status: DC | PRN

## 2023-10-29 MED ORDER — ALPRAZOLAM 0.25 MG PO TABS: 0.2500 mg | ORAL_TABLET | Freq: Every day | ORAL | Status: DC | PRN

## 2023-10-29 MED ORDER — PROPOFOL 10 MG/ML IV BOLUS
INTRAVENOUS | Status: DC | PRN
  Administered 2023-10-29: 20 mg via INTRAVENOUS
  Administered 2023-10-29: 30 mg via INTRAVENOUS
  Administered 2023-10-29: 50 mg via INTRAVENOUS

## 2023-10-29 MED ORDER — PHENYLEPHRINE HCL-NACL 20-0.9 MG/250ML-% IV SOLN
INTRAVENOUS | Status: AC
  Filled 2023-10-29: qty 250

## 2023-10-29 MED ORDER — CHLORHEXIDINE GLUCONATE 0.12 % MT SOLN
15.0000 mL | Freq: Once | OROMUCOSAL | Status: AC
  Administered 2023-10-29: 15 mL via OROMUCOSAL
  Filled 2023-10-29: qty 15

## 2023-10-29 MED ORDER — 0.9 % SODIUM CHLORIDE (POUR BTL) OPTIME
TOPICAL | Status: DC | PRN
  Administered 2023-10-29 (×2): 1000 mL

## 2023-10-29 MED ORDER — PHENOL 1.4 % MT LIQD
1.0000 | OROMUCOSAL | Status: DC | PRN
Start: 2023-10-29 — End: 2023-10-30

## 2023-10-29 MED ORDER — SODIUM CHLORIDE 0.9 % IR SOLN
Status: DC | PRN
  Administered 2023-10-29: 1000 mL

## 2023-10-29 MED ORDER — PROPOFOL 1000 MG/100ML IV EMUL
INTRAVENOUS | Status: AC
  Filled 2023-10-29: qty 300

## 2023-10-29 MED ORDER — CEFAZOLIN SODIUM-DEXTROSE 2-4 GM/100ML-% IV SOLN
2.0000 g | INTRAVENOUS | Status: AC
  Administered 2023-10-29: 2 g via INTRAVENOUS
  Filled 2023-10-29: qty 100

## 2023-10-29 MED ORDER — OXYCODONE HCL 5 MG PO TABS
10.0000 mg | ORAL_TABLET | ORAL | Status: DC | PRN
  Administered 2023-10-29 (×2): 10 mg via ORAL
  Filled 2023-10-29: qty 2

## 2023-10-29 MED ORDER — SODIUM CHLORIDE 0.9 % IV SOLN: INTRAVENOUS | Status: DC

## 2023-10-29 MED ORDER — ONDANSETRON HCL 4 MG PO TABS
4.0000 mg | ORAL_TABLET | Freq: Four times a day (QID) | ORAL | Status: DC | PRN
  Administered 2023-10-30: 4 mg via ORAL
  Filled 2023-10-29: qty 1

## 2023-10-29 MED ORDER — POVIDONE-IODINE 10 % EX SWAB
2.0000 | Freq: Once | CUTANEOUS | Status: AC
  Administered 2023-10-29: 2 via TOPICAL

## 2023-10-29 MED ORDER — HYDROMORPHONE HCL 1 MG/ML IJ SOLN
0.5000 mg | INTRAMUSCULAR | Status: DC | PRN
  Administered 2023-10-29: 1 mg via INTRAVENOUS
  Filled 2023-10-29: qty 1

## 2023-10-29 MED ORDER — VENLAFAXINE HCL ER 75 MG PO CP24
75.0000 mg | ORAL_CAPSULE | Freq: Every day | ORAL | Status: DC
  Administered 2023-10-30: 75 mg via ORAL
  Filled 2023-10-29: qty 1

## 2023-10-29 MED ORDER — FENTANYL CITRATE (PF) 100 MCG/2ML IJ SOLN
INTRAMUSCULAR | Status: AC
Start: 2023-10-29 — End: 2023-10-29
  Filled 2023-10-29: qty 2

## 2023-10-29 MED ORDER — ONDANSETRON HCL 4 MG/2ML IJ SOLN
4.0000 mg | Freq: Four times a day (QID) | INTRAMUSCULAR | Status: DC | PRN
Start: 2023-10-29 — End: 2023-10-30
  Administered 2023-10-29: 4 mg via INTRAVENOUS
  Filled 2023-10-29: qty 2

## 2023-10-29 MED ORDER — DOCUSATE SODIUM 100 MG PO CAPS
100.0000 mg | ORAL_CAPSULE | Freq: Two times a day (BID) | ORAL | Status: DC
  Administered 2023-10-29 – 2023-10-30 (×2): 100 mg via ORAL
  Filled 2023-10-29 (×2): qty 1

## 2023-10-29 MED ORDER — AMISULPRIDE (ANTIEMETIC) 5 MG/2ML IV SOLN
10.0000 mg | Freq: Once | INTRAVENOUS | Status: AC
  Administered 2023-10-29: 10 mg via INTRAVENOUS

## 2023-10-29 MED ORDER — MIDAZOLAM HCL 2 MG/2ML IJ SOLN
INTRAMUSCULAR | Status: DC | PRN
  Administered 2023-10-29: 2 mg via INTRAVENOUS

## 2023-10-29 MED ORDER — ALBUMIN HUMAN 5 % IV SOLN: INTRAVENOUS | Status: DC | PRN

## 2023-10-29 MED ORDER — FENTANYL CITRATE (PF) 100 MCG/2ML IJ SOLN
INTRAMUSCULAR | Status: DC | PRN
  Administered 2023-10-29 (×2): 25 ug via INTRAVENOUS

## 2023-10-29 MED ORDER — DEXAMETHASONE SODIUM PHOSPHATE 10 MG/ML IJ SOLN
INTRAMUSCULAR | Status: DC | PRN
  Administered 2023-10-29: 10 mg via INTRAVENOUS

## 2023-10-29 SURGICAL SUPPLY — 45 items
BAG COUNTER SPONGE SURGICOUNT (BAG) ×1 IMPLANT
BENZOIN TINCTURE PRP APPL 2/3 (GAUZE/BANDAGES/DRESSINGS) ×1 IMPLANT
BLADE CLIPPER SURG (BLADE) IMPLANT
BLADE SAW SGTL 18X1.27X75 (BLADE) ×1 IMPLANT
COVER SURGICAL LIGHT HANDLE (MISCELLANEOUS) ×1 IMPLANT
DRAPE C-ARM 42X72 X-RAY (DRAPES) ×1 IMPLANT
DRAPE STERI IOBAN 125X83 (DRAPES) ×1 IMPLANT
DRAPE U-SHAPE 47X51 STRL (DRAPES) ×3 IMPLANT
DRSG AQUACEL AG ADV 3.5X10 (GAUZE/BANDAGES/DRESSINGS) ×1 IMPLANT
DURAPREP 26ML APPLICATOR (WOUND CARE) ×1 IMPLANT
ELECT BLADE 6.5 EXT (BLADE) IMPLANT
ELECTRODE BLDE 4.0 EZ CLN MEGD (MISCELLANEOUS) ×1 IMPLANT
ELECTRODE REM PT RTRN 9FT ADLT (ELECTROSURGICAL) ×1 IMPLANT
FACESHIELD WRAPAROUND (MASK) ×2 IMPLANT
FACESHIELD WRAPAROUND OR TEAM (MASK) ×2 IMPLANT
GLOVE BIOGEL PI IND STRL 8 (GLOVE) ×2 IMPLANT
GLOVE ECLIPSE 8.0 STRL XLNG CF (GLOVE) ×1 IMPLANT
GLOVE ORTHO TXT STRL SZ7.5 (GLOVE) ×2 IMPLANT
GOWN STRL REUS W/ TWL LRG LVL3 (GOWN DISPOSABLE) ×2 IMPLANT
GOWN STRL REUS W/ TWL XL LVL3 (GOWN DISPOSABLE) ×2 IMPLANT
HEAD CERAMIC 36 PLUS5 (Hips) IMPLANT
KIT BASIN OR (CUSTOM PROCEDURE TRAY) ×1 IMPLANT
KIT TURNOVER KIT B (KITS) ×1 IMPLANT
LINER NEUTRAL 52X36MM PLUS 4 (Liner) IMPLANT
MANIFOLD NEPTUNE II (INSTRUMENTS) ×1 IMPLANT
NS IRRIG 1000ML POUR BTL (IV SOLUTION) ×1 IMPLANT
PACK TOTAL JOINT (CUSTOM PROCEDURE TRAY) ×1 IMPLANT
PAD ARMBOARD POSITIONER FOAM (MISCELLANEOUS) ×1 IMPLANT
PIN SECTOR W/GRIP ACE CUP 52MM (Hips) IMPLANT
SCREW 6.5MMX25MM (Screw) IMPLANT
SCREW PINN CAN 6.5X20 (Screw) IMPLANT
SET HNDPC FAN SPRY TIP SCT (DISPOSABLE) ×1 IMPLANT
STAPLER SKIN PROX 35W (STAPLE) IMPLANT
STEM FEM ACTIS HIGH SZ2 (Stem) IMPLANT
STRIP CLOSURE SKIN 1/2X4 (GAUZE/BANDAGES/DRESSINGS) ×2 IMPLANT
SUT ETHIBOND NAB CT1 #1 30IN (SUTURE) ×1 IMPLANT
SUT MNCRL AB 4-0 PS2 18 (SUTURE) IMPLANT
SUT VIC AB 0 CT1 27XBRD ANBCTR (SUTURE) ×1 IMPLANT
SUT VIC AB 1 CT1 27XBRD ANBCTR (SUTURE) ×1 IMPLANT
SUT VIC AB 2-0 CT1 TAPERPNT 27 (SUTURE) ×1 IMPLANT
TOWEL GREEN STERILE (TOWEL DISPOSABLE) ×1 IMPLANT
TOWEL GREEN STERILE FF (TOWEL DISPOSABLE) ×1 IMPLANT
TRAY CATH INTERMITTENT SS 16FR (CATHETERS) IMPLANT
TRAY FOLEY W/BAG SLVR 16FR ST (SET/KITS/TRAYS/PACK) IMPLANT
WATER STERILE IRR 1000ML POUR (IV SOLUTION) ×2 IMPLANT

## 2023-10-29 NOTE — Discharge Instructions (Signed)

## 2023-10-29 NOTE — Op Note (Signed)
 Operative Note  Date of operation: 10/29/2023 Preoperative diagnosis: Right hip severe end-stage osteoarthritis Postoperative diagnosis: Same  Procedure: Right direct anterior total hip arthroplasty  Implants: Implant Name Type Inv. Item Serial No. Manufacturer Lot No. LRB No. Used Action  PIN SECTOR W/GRIP ACE CUP - [id number] Hips PIN SECTOR W/GRIP ACE CUP  [employer] [id number] Right 1 Implanted  LINER NEUTRAL 52X36MM PLUS 4 - [id number] Liner LINER NEUTRAL 52X36MM PLUS 4  [employer]PU[employer] [id number] Right 1 Implanted  SCREW PINN CAN 6.5X20 - [id number] Screw SCREW PINN CAN 6.5X20  [employer]PU[employer]  Right 1 Implanted  HEAD CERAMIC 36 PLUS5 - [id number] Hips HEAD CERAMIC 36 PLUS5  [employer]PU[employer] [id number] Right 1 Implanted  STEM FEM ACTIS HIGH SZ2 - [id number] Stem STEM FEM ACTIS HIGH SZ2  [employer]PU[employer] [id number] Right 1 Implanted   Surgeon: Jeanella Milan. Lucienne Ryder, MD Assistant: Malena Scull, PA-C  Anesthesia: Spinal EBL: 550 cc Antibiotics: IV Ancef Complications: None  Indications: The patient is a 58 year old female with debilitating and stage arthritis involving her right hip this been well-documented with clinical exam and x-ray findings.  She has tried and failed all forms conservative treatment.  She is morbidly obese with a BMI of just over 40 but she has been her weight loss journey and has been significantly higher and continues to lose weight.  We felt comfortable with her proceeding with surgery based on her body habitus and her continuing to lose weight.  She is not a poorly controlled diabetic at all.  We did discuss in length in detail the heightened risks of acute blood loss anemia, nerve vessel injury, fracture, infection, DVT, dislocation, implant failure, leg length differences and wound healing issues with all these being heightened given her morbid obesity.  She understands that our goals are hopefully decreased pain, improved mobility and improved  quality of life.  Procedure description: After informed consent was obtained and the appropriate right hip was marked, the patient was brought to the operating room and set up on the stretcher where spinal anesthesia was obtained.  She was then laid in supine position on stretcher and a Foley catheter was placed.  Assessed her leg lengths and then placed traction boots on both her feet.  Next she was placed supine on the Hana fracture table with a perineal post and placed in both legs and inline skeletal traction vices no traction applied.  Her right operative hip and pelvis were assessed radiographically.  The right hip was then prepped and draped with DuraPrep and sterile drapes.  A timeout was called and she was identified as the correct patient and the correct right hip.  An incision was then made just inferior and posterior to the ASIS and carried slightly obliquely down the leg.  Dissection was carried down to the tensor fascia lata muscle and the tensor fascia was then divided longitudinally to proceed with a direct anterior approach to the hip.  Circumflex vessels were identified and cauterized.  The hip capsule was identified and opened up in an L-type format finding a moderate joint effusion.  Cobra retractors were placed around the medial and lateral femoral neck and a femoral neck cut was made with an oscillating saw just proximal to the lesser trochanter.  This cut was completed with an osteotome.  A corkscrew guide was placed in the femoral head and the femoral head was removed in its entirety and was found to be flattened and devoid of cartilage.  A bent Hohmann was  then placed over the medial acetabular rim and remnants of the acetabular labrum and other debris removed.  Reaming was then initiated from a size 43 reamer and stepwise up to a size 51 reamer with all reamers placed under direct visualization and the last reamer placed under direct fluoroscopy in order to obtain the depth reaming, the  inclination and the anteversion.  the real depuy sector gription acetabular clinic size 52 was then placed followed by single dome screw.  a 36+4 polythene liner was then placed within that 52 acetabular implant.  attention was then turned to the femur.  with the right leg externally rotated to 120 degrees, extended and adducted, a mueller retractor was placed medially and a hohmann retractor behind the greater trochanter.  The lateral joint capsule was released and a box cutting osteotome was used in the femoral canal.  He was then initiated using the Actis broaching system from a size 0 going to a size 2.  We then trialed a offset femoral neck combined with a 36+1.5 trial hip ball.  We brought the leg over and up and with traction and internal rotation reduced in the pelvis.  Based on radiographic and clinical assessment we felt like we needed just a little bit more leg length.  We then dislocated the hip and removed the trial components.  We then placed the real Actis femoral component size 2 with high offset and the real 36+5 ceramic head ball and again reduces in the acetabulum and we felt it was stable clinically and mechanically.  We assessed it radiographically as well as were pleased.  We then irrigated the soft tissue with normal saline solution.  The remnants of the joint capsule were closed with interrupted #1 Ethibond suture followed by #1 Vicryl to close the tensor fascia.  0 Vicryl was used to close the deep tissue and 2-0 Vicryl was used to close subcutaneous tissue.  The skin was closed with interrupted 2-0 nylon suture.  Xeroform and well-padded sterile dressing was applied.  She was taken off the Hana table and taken to the recovery room in stable condition.  Malena Scull, PA-C did assist during the entire case and beginning to end and his assistant is crucial for soft tissue management and retraction, helping guide implant placement and a layered closure of the wound.

## 2023-10-29 NOTE — Evaluation (Signed)
 Physical Therapy Evaluation Patient Details Name: Jenny Stephenson MRN: [id number] DOB: 1965/12/17 Today's Date: 10/29/2023  History of Present Illness  58 yo female s/p R DA-THA on 5/29. PMH includes OA, GERD, anxiety, BPPV, obesity.  Clinical Impression  Pt presents with R hip pain, impaired balance, impaired activity tolerance, and post-operative RLE weakness. Pt to benefit from acute PT to address deficits. Pt ambulated short room distance with light assist and RW use, pt limited in tolerance by pain, nausea, and dizziness. Pt educated on ankle pumps (20/hour) to perform this afternoon/evening to increase circulation, to pt's tolerance and limited by pain. PT to progress mobility as tolerated, and will continue to follow acutely.            If plan is discharge home, recommend the following: A little help with walking and/or transfers;A little help with bathing/dressing/bathroom   Can travel by private vehicle        Equipment Recommendations Rolling walker (2 wheels)  Recommendations for Other Services       Functional Status Assessment Patient has had a recent decline in their functional status and demonstrates the ability to make significant improvements in function in a reasonable and predictable amount of time.     Precautions / Restrictions Precautions Precautions: Fall Precaution/Restrictions Comments: no precautions Restrictions Weight Bearing Restrictions Per Provider Order: Yes RLE Weight Bearing Per Provider Order: Weight bearing as tolerated      Mobility  Bed Mobility Overal bed mobility: Needs Assistance Bed Mobility: Supine to Sit, Sit to Supine     Supine to sit: Min assist, HOB elevated, Used rails Sit to supine: Min assist, HOB elevated, Used rails   General bed mobility comments: assist for RLE progression to/from EOB, cues for sequencing.    Transfers Overall transfer level: Needs assistance Equipment used: Rolling walker (2  wheels) Transfers: Sit to/from Stand Sit to Stand: Min assist           General transfer comment: light rise and steady assist, cues for hand placement when sitting and standing.    Ambulation/Gait Ambulation/Gait assistance: Min assist Gait Distance (Feet): 15 Feet Assistive device: Rolling walker (2 wheels) Gait Pattern/deviations: Step-through pattern, Decreased stride length, Trunk flexed, Antalgic Gait velocity: decr     General Gait Details: assist to steady and manage RW, cues for proximity to RW and upright posture. limited in distance by nausea, dizziness.  Stairs            Wheelchair Mobility     Tilt Bed    Modified Rankin (Stroke Patients Only)       Balance Overall balance assessment: Mild deficits observed, not formally tested                                           Pertinent Vitals/Pain Pain Assessment Pain Assessment: 0-10 Pain Score: 6  Pain Location: R hip Pain Descriptors / Indicators: Discomfort, Grimacing, Guarding Pain Intervention(s): Monitored during session, Repositioned, Limited activity within patient's tolerance    Home Living Family/patient expects to be discharged to:: Private residence Living Arrangements: Spouse/significant other;Children Available Help at Discharge: Family Type of Home: House Home Access: Stairs to enter Entrance Stairs-Rails: None Entrance Stairs-Number of Steps: 2   Home Layout: Able to live on main level with bedroom/bathroom Home Equipment: Rollator (4 wheels);Shower seat - built in;Cane - quad      Prior Function Prior  Level of Function : Independent/Modified Independent             Mobility Comments: using cane PTA, reports no falls ADLs Comments: difficulty with LB dressing, husband helps as needed     Extremity/Trunk Assessment   Upper Extremity Assessment Upper Extremity Assessment: Defer to OT evaluation    Lower Extremity Assessment Lower Extremity  Assessment: RLE deficits/detail RLE Deficits / Details: anticipated post-operative weakness; able to perform ankle pumps, quad set, active assist heel slide RLE: Unable to fully assess due to pain    Cervical / Trunk Assessment Cervical / Trunk Assessment: Normal  Communication   Communication Communication: No apparent difficulties    Cognition Arousal: Alert Behavior During Therapy: WFL for tasks assessed/performed   PT - Cognitive impairments: No apparent impairments                         Following commands: Intact       Cueing Cueing Techniques: Verbal cues     General Comments      Exercises     Assessment/Plan    PT Assessment Patient needs continued PT services  PT Problem List Decreased strength;Decreased mobility;Decreased activity tolerance;Decreased balance;Decreased knowledge of use of DME;Pain;Decreased range of motion;Decreased knowledge of precautions       PT Treatment Interventions DME instruction;Therapeutic activities;Gait training;Therapeutic exercise;Patient/family education;Balance training;Stair training;Functional mobility training;Neuromuscular re-education    PT Goals (Current goals can be found in the Care Plan section)  Acute Rehab PT Goals PT Goal Formulation: With patient Time For Goal Achievement: 11/05/23 Potential to Achieve Goals: Good    Frequency 7X/week     Co-evaluation               AM-PAC PT "6 Clicks" Mobility  Outcome Measure Help needed turning from your back to your side while in a flat bed without using bedrails?: A Little Help needed moving from lying on your back to sitting on the side of a flat bed without using bedrails?: A Little Help needed moving to and from a bed to a chair (including a wheelchair)?: A Little Help needed standing up from a chair using your arms (e.g., wheelchair or bedside chair)?: A Little Help needed to walk in hospital room?: A Little Help needed climbing 3-5 steps with  a railing? : A Lot 6 Click Score: 17    End of Session Equipment Utilized During Treatment: Gait belt Activity Tolerance: Patient tolerated treatment well;Patient limited by fatigue Patient left: in bed;with call bell/phone within reach;with bed alarm set;with family/visitor present;with nursing/sitter in room Nurse Communication: Mobility status;Other (comment) (requesting antinausea medication) PT Visit Diagnosis: Muscle weakness (generalized) (M62.81);Difficulty in walking, not elsewhere classified (R26.2)    Time: 1540-1600 PT Time Calculation (min) (ACUTE ONLY): 20 min   Charges:   PT Evaluation $PT Eval Low Complexity: 1 Low   PT General Charges $$ ACUTE PT VISIT: 1 Visit         Shirlene Doughty, PT DPT Acute Rehabilitation Services Secure Chat Preferred  Office [phone]   Ethridge Herder 10/29/2023, 4:37 PM

## 2023-10-29 NOTE — Interval H&P Note (Signed)
 History and Physical Interval Note: The patient understands that she is here today for right total hip replacement to treat her significant right hip pain and arthritis.  There has been no acute or interval change in her medical status.  The risks and benefits of surgery have been discussed in detail and informed consent has been obtained.  The right operative hip has been marked.  10/29/2023 7:09 AM  Jenny Stephenson  has presented today for surgery, with the diagnosis of osteoarthritis right hip.  The various methods of treatment have been discussed with the patient and family. After consideration of risks, benefits and other options for treatment, the patient has consented to  Procedure(s): ARTHROPLASTY, HIP, TOTAL, ANTERIOR APPROACH (Right) as a surgical intervention.  The patient's history has been reviewed, patient examined, no change in status, stable for surgery.  I have reviewed the patient's chart and labs.  Questions were answered to the patient's satisfaction.     [name]

## 2023-10-29 NOTE — Transfer of Care (Signed)
 Immediate Anesthesia Transfer of Care Note  Patient: Jenny Stephenson  Procedure(s) Performed: ARTHROPLASTY, HIP, TOTAL, ANTERIOR APPROACH (Right: Hip)  Patient Location: PACU  Anesthesia Type:MAC and Spinal  Level of Consciousness: awake, alert , oriented, and patient cooperative  Airway & Oxygen Therapy: Patient Spontanous Breathing  Post-op Assessment: Report given to RN and Post -op Vital signs reviewed and stable  Post vital signs: Reviewed and stable  Last Vitals:  Vitals Value Taken Time  BP 132/78 10/29/23 0942  Temp    Pulse 73 10/29/23 0946  Resp 17 10/29/23 0946  SpO2 96 % 10/29/23 0946  Vitals shown include unfiled device data.  Last Pain:  Vitals:   10/29/23 0610  TempSrc:   PainSc: 6       Patients Stated Pain Goal: 2 (10/29/23 0610)  Complications: There were no known notable events for this encounter.

## 2023-10-29 NOTE — Anesthesia Preprocedure Evaluation (Signed)
 Anesthesia Evaluation  Patient identified by MRN, date of birth, ID band Patient awake    Reviewed: Allergy & Precautions, NPO status , Patient's Chart, lab work & pertinent test results  History of Anesthesia Complications (+) PONV and history of anesthetic complications  Airway Mallampati: III  TM Distance: <3 FB Neck ROM: Full    Dental  (+) Teeth Intact, Dental Advisory Given   Pulmonary neg shortness of breath, neg sleep apnea, neg COPD, neg recent URI   breath sounds clear to auscultation       Cardiovascular hypertension,  Rhythm:Regular     Neuro/Psych neg Seizures  Anxiety      Neuromuscular disease    GI/Hepatic Neg liver ROS,GERD  Controlled,,  Endo/Other  negative endocrine ROS    Renal/GU negative Renal ROS     Musculoskeletal  (+) Arthritis ,    Abdominal   Peds  Hematology negative hematology ROS (+) Lab Results      Component                Value               Date                      WBC                      13.1 (H)            10/15/2023                HGB                      13.6                10/15/2023                HCT                      43.1                10/15/2023                MCV                      88.5                10/15/2023                PLT                      343                 10/15/2023              Anesthesia Other Findings   Reproductive/Obstetrics                              Anesthesia Physical Anesthesia Plan  ASA: 2  Anesthesia Plan: MAC and Spinal   Post-op Pain Management: Ofirmev IV (intra-op)*   Induction: Intravenous  PONV Risk Score and Plan: 4 or greater and Ondansetron, Dexamethasone , Propofol  infusion, TIVA and Midazolam  Airway Management Planned: Nasal Cannula, Natural Airway and Simple Face Mask  Additional Equipment: None  Intra-op Plan:   Post-operative Plan:   Informed Consent: I have reviewed the  patients History and Physical, chart, labs  and discussed the procedure including the risks, benefits and alternatives for the proposed anesthesia with the patient or authorized representative who has indicated his/her understanding and acceptance.     Dental advisory given  Plan Discussed with: CRNA  Anesthesia Plan Comments:          Anesthesia Quick Evaluation

## 2023-10-30 ENCOUNTER — Encounter (HOSPITAL_COMMUNITY): Payer: Self-pay | Admitting: Orthopaedic Surgery

## 2023-10-30 ENCOUNTER — Telehealth: Payer: Self-pay | Admitting: Physician Assistant

## 2023-10-30 DIAGNOSIS — M1611 Unilateral primary osteoarthritis, right hip: Secondary | ICD-10-CM | POA: Diagnosis not present

## 2023-10-30 LAB — BASIC METABOLIC PANEL WITH GFR
Anion gap: 10 (ref 5–15)
BUN: 11 mg/dL (ref 6–20)
CO2: 24 mmol/L (ref 22–32)
Calcium: 9.2 mg/dL (ref 8.9–10.3)
Chloride: 102 mmol/L (ref 98–111)
Creatinine, Ser: 0.94 mg/dL (ref 0.44–1.00)
GFR, Estimated: >60 mL/min (ref >60)
Glucose, Bld: 131 mg/dL — ABNORMAL HIGH (ref 70–99)
Potassium: 3.6 mmol/L (ref 3.5–5.1)
Sodium: 136 mmol/L (ref 135–145)

## 2023-10-30 LAB — CBC
HCT: 30.1 % — ABNORMAL LOW (ref 36.0–46.0)
Hemoglobin: 9.9 g/dL — ABNORMAL LOW (ref 12.0–15.0)
MCH: 28.4 pg (ref 26.0–34.0)
MCHC: 32.9 g/dL (ref 30.0–36.0)
MCV: 86.5 fL (ref 80.0–100.0)
Platelets: 303 10*3/uL (ref 150–400)
RBC: 3.48 MIL/uL — ABNORMAL LOW (ref 3.87–5.11)
RDW: 14.1 % (ref 11.5–15.5)
WBC: 13.4 10*3/uL — ABNORMAL HIGH (ref 4.0–10.5)
nRBC: 0 % (ref 0.0–0.2)

## 2023-10-30 MED ORDER — METHOCARBAMOL 500 MG PO TABS: 500.0000 mg | ORAL_TABLET | Freq: Four times a day (QID) | ORAL | 1 refills | Status: DC | PRN

## 2023-10-30 MED ORDER — ASPIRIN 81 MG PO CHEW: 81.0000 mg | CHEWABLE_TABLET | Freq: Two times a day (BID) | ORAL | 0 refills | Status: DC

## 2023-10-30 MED ORDER — OXYCODONE HCL 5 MG PO TABS: 5.0000 mg | ORAL_TABLET | Freq: Four times a day (QID) | ORAL | 0 refills | Status: DC | PRN

## 2023-10-30 NOTE — Telephone Encounter (Signed)
 Josh from [facility] health called from Medical records requesting a call back from [place] or [place] on his secure line about orders for pt. Please call Josh at [phone]

## 2023-10-30 NOTE — Progress Notes (Signed)
 Physical Therapy Treatment  Patient Details Name: Jenny Stephenson MRN: [id number] DOB: December 16, 1965 Today's Date: 10/30/2023   History of Present Illness 58 yo female s/p R DA-THA on 5/29. PMH includes OA, GERD, anxiety, BPPV, obesity.    PT Comments  Pt progressing towards physical therapy goals. Pt reports pain and difficulty moving RLE actively however progressed nicely with therapeutic exercise and gait training in hall. Pt completed stair training and was educated on HEP, car transfer, and appropriate activity progression. Will continue to follow and progress as able per POC.     If plan is discharge home, recommend the following: A little help with walking and/or transfers;A little help with bathing/dressing/bathroom   Can travel by private vehicle        Equipment Recommendations  Rolling walker (2 wheels)    Recommendations for Other Services       Precautions / Restrictions Precautions Precautions: Fall Recall of Precautions/Restrictions: Intact Precaution/Restrictions Comments: no precautions - direct anterior approach Restrictions Weight Bearing Restrictions Per Provider Order: Yes RLE Weight Bearing Per Provider Order: Weight bearing as tolerated     Mobility  Bed Mobility Overal bed mobility: Needs Assistance Bed Mobility: Supine to Sit     Supine to sit: Min assist, HOB elevated, Used rails     General bed mobility comments: Light assist to advance RLE towards EOB.    Transfers Overall transfer level: Needs assistance Equipment used: Rolling walker (2 wheels) Transfers: Sit to/from Stand Sit to Stand: Contact guard assist           General transfer comment: VC's for hand placement on seated surface for safety. No assist required.    Ambulation/Gait Ambulation/Gait assistance: Contact guard assist, Supervision Gait Distance (Feet): 300 Feet Assistive device: Rolling walker (2 wheels) Gait Pattern/deviations: Step-through pattern, Decreased  stride length, Trunk flexed, Antalgic Gait velocity: Decreased Gait velocity interpretation: 1.31 - 2.62 ft/sec, indicative of limited community ambulator   General Gait Details: VC's for improved posture, closer walker proximity and forward gaze. No assist required but CGA fading to supervision by end of session.   Stairs Stairs: Yes Stairs assistance: Min assist Stair Management: One rail Right, Step to pattern, Forwards Number of Stairs: 3 General stair comments: VC's for sequencing and general safety. Pt's husband educated on safe guarding/assist technique.   Wheelchair Mobility     Tilt Bed    Modified Rankin (Stroke Patients Only)       Balance Overall balance assessment: Mild deficits observed, not formally tested                                          Communication Communication Communication: No apparent difficulties  Cognition Arousal: Alert Behavior During Therapy: WFL for tasks assessed/performed   PT - Cognitive impairments: No apparent impairments                         Following commands: Intact      Cueing Cueing Techniques: Verbal cues  Exercises Total Joint Exercises Ankle Circles/Pumps: 10 reps Quad Sets: 10 reps Short Arc Quad: 10 reps Heel Slides: 10 reps Hip ABduction/ADduction: 10 reps Long Arc Quad: 10 reps    General Comments        Pertinent Vitals/Pain Pain Assessment Pain Assessment: Faces Faces Pain Scale: Hurts little more Pain Location: R hip Pain Descriptors / Indicators: Discomfort, Grimacing, Operative  site guarding Pain Intervention(s): Monitored during session, Limited activity within patient's tolerance, Repositioned    Home Living                          Prior Function            PT Goals (current goals can now be found in the care plan section) Acute Rehab PT Goals Patient Stated Goal: Home today PT Goal Formulation: With patient Time For Goal Achievement:  11/05/23 Potential to Achieve Goals: Good Progress towards PT goals: Progressing toward goals    Frequency    7X/week      PT Plan      Co-evaluation              AM-PAC PT "6 Clicks" Mobility   Outcome Measure  Help needed turning from your back to your side while in a flat bed without using bedrails?: None Help needed moving from lying on your back to sitting on the side of a flat bed without using bedrails?: A Little Help needed moving to and from a bed to a chair (including a wheelchair)?: A Little Help needed standing up from a chair using your arms (e.g., wheelchair or bedside chair)?: A Little Help needed to walk in hospital room?: A Little Help needed climbing 3-5 steps with a railing? : A Little 6 Click Score: 19    End of Session Equipment Utilized During Treatment: Gait belt Activity Tolerance: Patient tolerated treatment well;Patient limited by fatigue Patient left: in bed;with call bell/phone within reach;with bed alarm set;with family/visitor present;with nursing/sitter in room Nurse Communication: Mobility status PT Visit Diagnosis: Muscle weakness (generalized) (M62.81);Difficulty in walking, not elsewhere classified (R26.2)     Time: 9562-1308 PT Time Calculation (min) (ACUTE ONLY): 34 min  Charges:    $Gait Training: 8-22 mins $Therapeutic Exercise: 8-22 mins PT General Charges $$ ACUTE PT VISIT: 1 Visit                     Simone Dubois, PT, DPT Acute Rehabilitation Services Secure Chat Preferred Office: [phone]    Jenny Stephenson 10/30/2023, 10:41 AM

## 2023-10-30 NOTE — Discharge Summary (Signed)
 Patient ID: Jenny Stephenson MRN: [id number] DOB/AGE: [birth year] 58 y.o.  Admit date: 10/29/2023 Discharge date: 10/30/2023  Admission Diagnoses:  Principal Problem:   Unilateral primary osteoarthritis, right hip Active Problems:   Status post total replacement of right hip   Discharge Diagnoses:  Same  Past Medical History:  Diagnosis Date   Anxiety    Arthritis    Carpal tunnel syndrome    bilateral   Hypertension    Obesity    PONV (postoperative nausea and vomiting)    Sciatica of left side 02/28/2014    Surgeries: Procedure(s): ARTHROPLASTY, HIP, TOTAL, ANTERIOR APPROACH on 10/29/2023   Consultants:   Discharged Condition: Improved  Hospital Course: Ziana Heyliger is an 58 y.o. female who was admitted 10/29/2023 for operative treatment ofUnilateral primary osteoarthritis, right hip. Patient has severe unremitting pain that affects sleep, daily activities, and work/hobbies. After pre-op clearance the patient was taken to the operating room on 10/29/2023 and underwent  Procedure(s): ARTHROPLASTY, HIP, TOTAL, ANTERIOR APPROACH.    Patient was given perioperative antibiotics:  Anti-infectives (From admission, onward)    Start     Dose/Rate Route Frequency Ordered Stop   10/29/23 1400  ceFAZolin (ANCEF) IVPB 2g/100 mL premix        2 g 200 mL/hr over 30 Minutes Intravenous Every 6 hours 10/29/23 1124 10/30/23 0738   10/29/23 0600  ceFAZolin (ANCEF) IVPB 2g/100 mL premix        2 g 200 mL/hr over 30 Minutes Intravenous On call to O.R. 10/29/23 0550 10/29/23 0740        Patient was given sequential compression devices, early ambulation, and chemoprophylaxis to prevent DVT.  Patient benefited maximally from hospital stay and there were no complications.    Recent vital signs: Patient Vitals for the past 24 hrs:  BP Temp Temp src Pulse Resp SpO2  10/30/23 0741 (!) 112/59 98.7 F (37.1 C) Oral 75 20 96 %  10/30/23 0249 133/60 99.3 F (37.4 C) Oral 76 20  93 %  10/29/23 2259 129/62 99 F (37.2 C) Oral 79 18 99 %  10/29/23 1936 135/64 98.5 F (36.9 C) Oral 74 18 98 %  10/29/23 1646 130/66 98.6 F (37 C) Oral 66 16 95 %     Recent laboratory studies:  Recent Labs    10/30/23 0954  WBC 13.4*  HGB 9.9*  HCT 30.1*  PLT 303  NA 136  K 3.6  CL 102  CO2 24  BUN 11  CREATININE 0.94  GLUCOSE 131*  CALCIUM 9.2     Discharge Medications:   Allergies as of 10/30/2023       Reactions   Tramadol  Itching   Other Reaction(s): insomnia, Other (See Comments) Pt can now tolerate this medication   Codeine Itching, Other (See Comments)   Hyper        Medication List     TAKE these medications    ALPRAZolam  0.25 MG tablet Commonly known as: XANAX  Take 1 tablet (0.25 mg total) by mouth daily as needed for anxiety.   aspirin 81 MG chewable tablet Chew 1 tablet (81 mg total) by mouth 2 (two) times daily.   gabapentin  300 MG capsule Commonly known as: NEURONTIN  Take 300 mg by mouth at bedtime as needed (pain).   ibuprofen 200 MG tablet Commonly known as: ADVIL Take 800 mg by mouth every 6 (six) hours as needed for moderate pain (pain score 4-6).   levocetirizine 5 MG tablet Commonly known as: XYZAL  Take  1 tablet (5 mg total) by mouth daily.   methocarbamol  500 MG tablet Commonly known as: ROBAXIN  Take 1 tablet (500 mg total) by mouth every 6 (six) hours as needed for muscle spasms.   oxyCODONE  5 MG immediate release tablet Commonly known as: Oxy IR/ROXICODONE  Take 1-2 tablets (5-10 mg total) by mouth every 6 (six) hours as needed for moderate pain (pain score 4-6) (pain score 4-6).   tirzepatide 10 MG/0.5ML Pen Commonly known as: ZEPBOUND Inject 10 mg into the skin once a week.   venlafaxine  XR 75 MG 24 hr capsule Commonly known as: Effexor  XR Take 1 capsule (75 mg total) by mouth daily with breakfast.               Durable Medical Equipment  (From admission, onward)           Start     Ordered    10/29/23 1125  DME 3 n 1  Once        10/29/23 1124   10/29/23 1125  DME Walker rolling  Once       Question Answer Comment  Walker: With 5 Inch Wheels   Patient needs a walker to treat with the following condition Status post total replacement of right hip      10/29/23 1124            Diagnostic Studies: DG Pelvis Portable Result Date: 10/29/2023 CLINICAL DATA:  Status post right hip replacement. EXAM: PORTABLE PELVIS 1-2 VIEWS COMPARISON:  None Available. FINDINGS: Right hip arthroplasty in expected alignment. No periprosthetic lucency or fracture. Recent postsurgical change includes air and edema in the soft tissues. IMPRESSION: Right hip arthroplasty without immediate postoperative complication. Electronically Signed   By: Chadwick Colonel M.D.   On: 10/29/2023 11:12   DG HIP UNILAT WITH PELVIS 1V RIGHT Result Date: 10/29/2023 CLINICAL DATA:  Right hip replacement EXAM: DG HIP (WITH OR WITHOUT PELVIS) 1V RIGHT COMPARISON:  None Available. FLUOROSCOPY TIME:  Radiation Exposure Index (as provided by the fluoroscopic device): 3.92 mGy If the device does not provide the exposure index: Fluoroscopy Time:  26 seconds Number of Acquired Images:  3 FINDINGS: Right hip prosthesis is noted in satisfactory position. IMPRESSION: Status post right hip replacement Electronically Signed   By: Violeta Grey M.D.   On: 10/29/2023 09:48   DG C-Arm 1-60 Min-No Report Result Date: 10/29/2023 Fluoroscopy was utilized by the requesting physician.  No radiographic interpretation.   XR Lumbar Spine 2-3 Views Result Date: 10/01/2023 2 views of the lumbar spine show no acute findings with some mild degenerative changes.   Disposition: Discharge disposition: 01-Home or Self Care          Follow-up Information     [name], MD Follow up in 2 week(s).   Specialty: Orthopedic Surgery Contact information: [address] [place] [address] [phone]         Care, [facility] Follow up.   Specialty: Home Health Services Why: Gasper Karst will contact you for the first home visit Contact information: [address] [place] [address] [phone]                  Signed: [name] 10/30/2023, 1:43 PM

## 2023-10-30 NOTE — TOC Transition Note (Signed)
 Transition of Care [facility]) - Discharge Note   Patient Details  Name: Jenny Stephenson MRN: [id number] Date of Birth: 09-Jan-1966  [facility] CM/SW Contact:  Jonathan Neighbor, RN Phone Number: 10/30/2023, 9:19 AM   Clinical Narrative:     Pt is discharging home with home health through [facility]. Information on the AVS. Gasper Karst will contact her for the first home visit.  Pt has transport home.  Final next level of care: Home w Home Health Services Barriers to Discharge: No Barriers Identified   Patient Goals and CMS Choice     Choice offered to / list presented to : Patient      Discharge Placement                       Discharge Plan and Services Additional resources added to the After Visit Summary for                            [facility] Arranged: PT [facility] Agency: [facility] Care Date [facility] Agency Contacted: 10/30/23   Representative spoke with at [facility] Agency: Randel Buss  Social Drivers of Health (SDOH) Interventions SDOH Screenings   Food Insecurity: No Food Insecurity (05/18/2023)  Housing: Unknown (05/18/2023)  Transportation Needs: No Transportation Needs (05/18/2023)  Depression (PHQ2-9): Low Risk  (01/29/2023)  Financial Resource Strain: Low Risk  (05/18/2023)  Physical Activity: Insufficiently Active (05/18/2023)  Social Connections: Socially Integrated (05/18/2023)  Stress: No Stress Concern Present (05/18/2023)  Tobacco Use: Low Risk  (10/29/2023)     Readmission Risk Interventions     No data to display

## 2023-10-30 NOTE — Telephone Encounter (Signed)
 I spoke with Josh. He just wanted to be sure we would sign for home health orders. Josh advised.

## 2023-10-30 NOTE — Progress Notes (Signed)
 Patient awaiting family for discharge home, Patient in no acute distress nor complaints of pain nor discomfort; incision on back is clean, dry and intact; No c/o pain at this time. Room was checked and accounted for all patient's belongings; discharge instructions concerning her medications, incision care, follow up appointment and when to call the doctor as needed were all discussed with patient by RN and she expressed understanding on the instructions given.

## 2023-10-30 NOTE — Progress Notes (Signed)
 Subjective: 1 Day Post-Op Procedure(s) (LRB): ARTHROPLASTY, HIP, TOTAL, ANTERIOR APPROACH (Right) Patient reports pain as moderate.    Objective: Vital signs in last 24 hours: Temp:  [97.8 F (36.6 C)-99.3 F (37.4 C)] 99.3 F (37.4 C) (05/30 0249) Pulse Rate:  [58-79] 76 (05/30 0249) Resp:  [16-25] 20 (05/30 0249) BP: (121-135)/(53-81) 133/60 (05/30 0249) SpO2:  [93 %-99 %] 93 % (05/30 0249)  Intake/Output from previous day: 05/29 0701 - 05/30 0700 In: 2020 [P.O.:720; I.V.:700; IV Piggyback:600] Out: 1450 [Urine:900; Blood:550] Intake/Output this shift: Total I/O In: -  Out: 400 [Urine:400]  No results for input(s): "HGB" in the last 72 hours. No results for input(s): "WBC", "RBC", "HCT", "PLT" in the last 72 hours. No results for input(s): "NA", "K", "CL", "CO2", "BUN", "CREATININE", "GLUCOSE", "CALCIUM" in the last 72 hours. No results for input(s): "LABPT", "INR" in the last 72 hours.  Neurovascular intact Sensation intact distally Intact pulses distally Dorsiflexion/Plantar flexion intact Incision: scant drainage   Assessment/Plan: 1 Day Post-Op Procedure(s) (LRB): ARTHROPLASTY, HIP, TOTAL, ANTERIOR APPROACH (Right) Up with therapy Discharge home with home health      Jenny Stephenson 10/30/2023, 6:45 AM

## 2023-11-01 ENCOUNTER — Encounter (HOSPITAL_COMMUNITY): Payer: Self-pay | Admitting: Orthopaedic Surgery

## 2023-11-01 MED ORDER — BUPIVACAINE IN DEXTROSE 0.75-8.25 % IT SOLN
INTRATHECAL | Status: DC | PRN
  Administered 2023-10-29: 1.8 mL via INTRATHECAL

## 2023-11-01 NOTE — Anesthesia Procedure Notes (Signed)
 Spinal  Patient location during procedure: OR Start time: 10/29/2023 7:26 AM End time: 10/29/2023 7:33 AM Reason for block: surgical anesthesia Staffing Performed: anesthesiologist  Anesthesiologist: Arvie Latus, MD Performed by: Arvie Latus, MD Authorized by: Arvie Latus, MD   Preanesthetic Checklist Completed: patient identified, IV checked, risks and benefits discussed, surgical consent, monitors and equipment checked, pre-op evaluation and timeout performed Spinal Block Patient position: sitting Prep: DuraPrep Patient monitoring: heart rate, cardiac monitor, continuous pulse ox and blood pressure Approach: midline Location: L3-4 Injection technique: single-shot Needle Needle type: Pencan  Needle gauge: 24 G Needle length: 9 cm Assessment Sensory level: T6 Events: CSF return

## 2023-11-01 NOTE — Anesthesia Postprocedure Evaluation (Signed)
 Anesthesia Post Note  Patient: Jenny Stephenson  Procedure(s) Performed: ARTHROPLASTY, HIP, TOTAL, ANTERIOR APPROACH (Right: Hip)     Patient location during evaluation: PACU Anesthesia Type: MAC and Spinal Level of consciousness: awake and alert Pain management: pain level controlled Vital Signs Assessment: post-procedure vital signs reviewed and stable Respiratory status: spontaneous breathing, nonlabored ventilation and respiratory function stable Cardiovascular status: stable and blood pressure returned to baseline Postop Assessment: no apparent nausea or vomiting Anesthetic complications: no   There were no known notable events for this encounter.            Mazen Marcin

## 2023-11-02 ENCOUNTER — Other Ambulatory Visit: Payer: Self-pay | Admitting: Physician Assistant

## 2023-11-02 ENCOUNTER — Telehealth: Payer: Self-pay | Admitting: Radiology

## 2023-11-02 MED ORDER — ONDANSETRON 4 MG PO TBDP: 4.0000 mg | ORAL_TABLET | Freq: Three times a day (TID) | ORAL | 0 refills | Status: DC | PRN

## 2023-11-02 NOTE — Telephone Encounter (Signed)
 Patient is status post right  THA on 10/29/2023.  She states that she continues to be nauseated and have light headedness. She discontinued pain medication 24 hours ago and it seems to be worse.  She has taken her blood pressure this morning and it is 126/65.  She would like to know if there is anything that she can do and to make sure this is normal?  Patient requests return call to [phone].

## 2023-11-05 ENCOUNTER — Other Ambulatory Visit: Payer: Self-pay

## 2023-11-05 ENCOUNTER — Encounter: Payer: Self-pay | Admitting: Internal Medicine

## 2023-11-05 ENCOUNTER — Telehealth: Payer: Self-pay | Admitting: Orthopaedic Surgery

## 2023-11-05 NOTE — Telephone Encounter (Signed)
 Patient called regarding nausea. She says she has been taking the Zofran  but is still having nausea and spells of light headedness. She says she threw up for the first time this morning. She is unable to eat or drink as usual, although she did have a protein shake this morning. She denies any fever or chills. She changed her dressing and says that the incision looks good. She has only been taking Ibuprofen for pain and says it is tolerable; she did not take any Ibuprofen today. Please advise.

## 2023-11-05 NOTE — Telephone Encounter (Signed)
 LMOM for patient that we could call her in a different nausea medication. Or she could go ahead and see her PCP for the nausea and light headedness

## 2023-11-06 ENCOUNTER — Telehealth: Payer: Self-pay

## 2023-11-06 ENCOUNTER — Telehealth: Payer: Self-pay | Admitting: Radiology

## 2023-11-06 ENCOUNTER — Other Ambulatory Visit: Payer: Self-pay

## 2023-11-06 MED ORDER — PROMETHAZINE HCL 12.5 MG PO TABS: 12.5000 mg | ORAL_TABLET | Freq: Three times a day (TID) | ORAL | 0 refills | Status: DC | PRN

## 2023-11-06 NOTE — Telephone Encounter (Signed)
 Copied from CRM [id number]. Topic: General - Other >> Nov 06, 2023 10:39 AM Turkey A wrote: Reason for CRM: Patient would like for Kerney Pee to call her

## 2023-11-06 NOTE — Telephone Encounter (Signed)
 See mychart message for documentation

## 2023-11-06 NOTE — Telephone Encounter (Signed)
 Can you please send in the phenergan for this pt? She states that she is going to try to get in with her PCP but it will take a while

## 2023-11-06 NOTE — Telephone Encounter (Signed)
 Sent in to pharmacy.

## 2023-11-06 NOTE — Telephone Encounter (Signed)
 FYI-  Called patient to discuss this message with her. She had a total hip done about a week ago. Has been having nausea and lightheadedness for 8 days. No improvement with zofran . Was advised to follow up with pcp. Advised that given her recent surgery and symptoms- needs to be evaluated at [facility] or ED today to confirm nothing acute.. Patient gave verbal understanding. Advised we could do a soon f/u with pcp if she would like.

## 2023-11-10 ENCOUNTER — Other Ambulatory Visit: Payer: Self-pay | Admitting: Orthopaedic Surgery

## 2023-11-16 ENCOUNTER — Encounter: Payer: Self-pay | Admitting: Orthopaedic Surgery

## 2023-11-16 ENCOUNTER — Ambulatory Visit (INDEPENDENT_AMBULATORY_CARE_PROVIDER_SITE_OTHER): Admitting: Orthopaedic Surgery

## 2023-11-16 DIAGNOSIS — Z96641 Presence of right artificial hip joint: Secondary | ICD-10-CM

## 2023-11-16 NOTE — Progress Notes (Signed)
 The patient is a 58 year old who comes in 2 weeks status post a right total hip arthroplasty.  She is doing well.  She is already off of pain medication.  She has been compliant with a baby aspirin  twice daily.  On exam she does have a large swollen thigh.  I did aspirate 130 cc of seroma fluid off of the hip area.  I did remove all the sutures.  These were nylon sutures.  There is an area that does need Bactroban ointment or mupirocin ointment over the wound daily which I showed her how to do.  She will stop her baby aspirin  twice daily and can continue to increase her activities as comfort allows.  She is off narcotics as she can drive from my standpoint.  I want her to watch her wound and clean it daily and place mupirocin ointment on it after each shower.  Will see her back in 2 weeks for wound check.

## 2023-12-02 ENCOUNTER — Telehealth: Payer: Self-pay

## 2023-12-02 ENCOUNTER — Encounter: Payer: Self-pay | Admitting: Orthopaedic Surgery

## 2023-12-02 ENCOUNTER — Ambulatory Visit (INDEPENDENT_AMBULATORY_CARE_PROVIDER_SITE_OTHER): Admitting: Orthopaedic Surgery

## 2023-12-02 DIAGNOSIS — Z96641 Presence of right artificial hip joint: Secondary | ICD-10-CM

## 2023-12-02 NOTE — Telephone Encounter (Signed)
 Copied from CRM [id number]. Topic: General - Other >> Dec 02, 2023 10:20 AM Burnard DEL wrote: Reason for CRM: Patient is requesting a phone call from [facility] [place].

## 2023-12-02 NOTE — Progress Notes (Signed)
 The patient is in follow-up status post a right total hip arthroplasty on 29 May.  We wanted to see her back today for wound check.  She has developed a seroma as well.  The wound is healed over nicely.  There is no redness and there is no open aspect of her right hip wound.  There is a large seroma and I was able to aspirate about 135 240 cc of fluid off of the wound.  This made her feel significantly better.  She is ambulating well.  She can submerge underwater from my standpoint.  Will see her back in a month to see how she is doing overall but no x-rays are needed.  If she does have a significant reaccumulation of fluid before then she will let us  know.

## 2023-12-03 NOTE — Telephone Encounter (Signed)
 Patient was wanting to schedule a follow up appointment with Dr Glendia. Appt has been scheduled.

## 2023-12-30 ENCOUNTER — Encounter: Payer: Self-pay | Admitting: Internal Medicine

## 2023-12-30 ENCOUNTER — Ambulatory Visit (INDEPENDENT_AMBULATORY_CARE_PROVIDER_SITE_OTHER): Admitting: Podiatry

## 2023-12-30 DIAGNOSIS — G5792 Unspecified mononeuropathy of left lower limb: Secondary | ICD-10-CM

## 2023-12-30 DIAGNOSIS — G5763 Lesion of plantar nerve, bilateral lower limbs: Secondary | ICD-10-CM

## 2023-12-30 DIAGNOSIS — M79671 Pain in right foot: Secondary | ICD-10-CM | POA: Insufficient documentation

## 2023-12-30 DIAGNOSIS — G5791 Unspecified mononeuropathy of right lower limb: Secondary | ICD-10-CM | POA: Diagnosis not present

## 2023-12-30 MED ORDER — TRIAMCINOLONE ACETONIDE 40 MG/ML IJ SUSP
20.0000 mg | Freq: Once | INTRAMUSCULAR | Status: AC
  Administered 2023-12-30: 20 mg

## 2023-12-30 NOTE — Progress Notes (Signed)
 She presents today for continued arthritis to the top of the foot bilaterally.  She had a right hip replacement and states that she is doing quite well with that and is also noticed that she started some burning and tingling with weightbearing beneath the knuckles of the right foot.  She states that the arthritis on the top of the feet are starting to get worse particularly the right 1.  Objective: Vital signs are stable alert and oriented x 3 pulses are palpable.  She has multiple osseous nodularities nonpulsatile dorsal aspect of the bilateral foot at the tarsometatarsal joints.  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  Plan: I injected the dorsal aspect of the foot today 20 mg Kenalog  5 mg Marcaine  point maximal tenderness.  Tolerated procedure well with no complications.  Follow-up with her as needed

## 2024-01-01 ENCOUNTER — Encounter: Payer: Self-pay | Admitting: Internal Medicine

## 2024-01-01 ENCOUNTER — Ambulatory Visit: Admitting: Internal Medicine

## 2024-01-01 VITALS — BP 128/70 | HR 74 | Resp 16 | Ht 66.0 in | Wt 248.0 lb

## 2024-01-01 DIAGNOSIS — D649 Anemia, unspecified: Secondary | ICD-10-CM | POA: Diagnosis not present

## 2024-01-01 DIAGNOSIS — M79671 Pain in right foot: Secondary | ICD-10-CM | POA: Diagnosis not present

## 2024-01-01 DIAGNOSIS — M533 Sacrococcygeal disorders, not elsewhere classified: Secondary | ICD-10-CM

## 2024-01-01 DIAGNOSIS — M79672 Pain in left foot: Secondary | ICD-10-CM

## 2024-01-01 DIAGNOSIS — Z1231 Encounter for screening mammogram for malignant neoplasm of breast: Secondary | ICD-10-CM

## 2024-01-01 DIAGNOSIS — R232 Flushing: Secondary | ICD-10-CM

## 2024-01-01 DIAGNOSIS — E7800 Pure hypercholesterolemia, unspecified: Secondary | ICD-10-CM | POA: Diagnosis not present

## 2024-01-01 LAB — BASIC METABOLIC PANEL WITH GFR
BUN: 24 mg/dL — ABNORMAL HIGH (ref 6–23)
CO2: 28 meq/L (ref 19–32)
Calcium: 9.4 mg/dL (ref 8.4–10.5)
Chloride: 103 meq/L (ref 96–112)
Creatinine, Ser: 0.6 mg/dL (ref 0.40–1.20)
GFR: 99.61 mL/min (ref >60.00)
Glucose, Bld: 89 mg/dL (ref 70–99)
Potassium: 3.9 meq/L (ref 3.5–5.1)
Sodium: 140 meq/L (ref 135–145)

## 2024-01-01 LAB — IBC + FERRITIN
Ferritin: 27.6 ng/mL (ref 10.0–291.0)
Iron: 39 ug/dL — ABNORMAL LOW (ref 42–145)
Saturation Ratios: 11 % — ABNORMAL LOW (ref 20.0–50.0)
TIBC: 355.6 ug/dL (ref 250.0–450.0)
Transferrin: 254 mg/dL (ref 212.0–360.0)

## 2024-01-01 LAB — CBC WITH DIFFERENTIAL/PLATELET
Basophils Absolute: 0.1 K/uL (ref 0.0–0.1)
Basophils Relative: 0.7 % (ref 0.0–3.0)
Eosinophils Absolute: 0.2 K/uL (ref 0.0–0.7)
Eosinophils Relative: 1.6 % (ref 0.0–5.0)
HCT: 39 % (ref 36.0–46.0)
Hemoglobin: 12.6 g/dL (ref 12.0–15.0)
Lymphocytes Relative: 27.2 % (ref 12.0–46.0)
Lymphs Abs: 2.9 K/uL (ref 0.7–4.0)
MCHC: 32.3 g/dL (ref 30.0–36.0)
MCV: 82.9 fl (ref 78.0–100.0)
Monocytes Absolute: 0.6 K/uL (ref 0.1–1.0)
Monocytes Relative: 6.1 % (ref 3.0–12.0)
Neutro Abs: 6.8 K/uL (ref 1.4–7.7)
Neutrophils Relative %: 64.4 % (ref 43.0–77.0)
Platelets: 352 K/uL (ref 150.0–400.0)
RBC: 4.7 Mil/uL (ref 3.87–5.11)
RDW: 14.7 % (ref 11.5–15.5)
WBC: 10.6 K/uL — ABNORMAL HIGH (ref 4.0–10.5)

## 2024-01-01 LAB — HEPATIC FUNCTION PANEL
ALT: 16 U/L (ref 0–35)
AST: 12 U/L (ref 0–37)
Albumin: 4.4 g/dL (ref 3.5–5.2)
Alkaline Phosphatase: 92 U/L (ref 39–117)
Bilirubin, Direct: 0.1 mg/dL (ref 0.0–0.3)
Total Bilirubin: 0.4 mg/dL (ref 0.2–1.2)
Total Protein: 7 g/dL (ref 6.0–8.3)

## 2024-01-01 LAB — VITAMIN B12: Vitamin B-12: 221 pg/mL (ref 211–911)

## 2024-01-01 LAB — TSH: TSH: 1.7 u[IU]/mL (ref 0.35–5.50)

## 2024-01-01 MED ORDER — VENLAFAXINE HCL ER 75 MG PO CP24: 75.0000 mg | ORAL_CAPSULE | Freq: Every day | ORAL | 1 refills | Status: DC

## 2024-01-01 NOTE — Progress Notes (Signed)
 Subjective:    Patient ID: Jenny Stephenson, female    DOB: Sep 24, 1965, 58 y.o.   MRN: [id number]  Patient here for  Chief Complaint  Patient presents with   Medical Management of Chronic Issues    HPI Here for a scheduled follow up - follow up regarding hypertension, hypercholesterolemia and GERD. Continues on zepbound. She is s/p right total hip arthroplasty 10/29/23.  Had f/u 11/16/23 -with ortho - s/p aspiration of seroma - hip area. Also had f/u 12/02/23 - s/p aspiration of seroma. Saw podiatry 12/30/23 - arthritis - top of foot bilaterally. 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 S/p kenalog  injection. She is doing well s/p her surgery. She has noticed pain - tail bone. Plans to discuss with ortho next week. Discussed using a gel cushion. Also reports - question - RLS. Breathing stable. No chest pain reported. No abdominal pain reported.    Past Medical History:  Diagnosis Date   Anxiety    Arthritis    Carpal tunnel syndrome    bilateral   Hypertension    Obesity    PONV (postoperative nausea and vomiting)    Sciatica of left side 02/28/2014   Past Surgical History:  Procedure Laterality Date   arm surgery  06/02/1997   screws/plates   CARPAL TUNNEL RELEASE Right 06/02/2006   CESAREAN SECTION  1993 and 2001   cholescystectomy  06/02/1993   COLONOSCOPY WITH PROPOFOL  N/A 07/24/2017   Procedure: COLONOSCOPY WITH PROPOFOL ;  Surgeon: Jinny Carmine, MD;  Location: [facility];  Service: Endoscopy;  Laterality: N/A;   FRACTURE SURGERY Left    arm   POLYPECTOMY  07/24/2017   Procedure: POLYPECTOMY;  Surgeon: Jinny Carmine, MD;  Location: [facility];  Service: Endoscopy;;   TONSILLECTOMY  06/03/1971   TOTAL HIP ARTHROPLASTY Right 10/29/2023   Procedure: ARTHROPLASTY, HIP, TOTAL, ANTERIOR APPROACH;  Surgeon: Vernetta Lonni GRADE, MD;  Location: [facility];  Service: Orthopedics;  Laterality: Right;   VAGINAL HYSTERECTOMY   06/02/2002   ovaries not removed, secondary to fibroids and bleeding   Family History  Problem Relation Age of Onset   Prostate cancer Father    Lymphoma Father    Hypertension Mother    Ulcerative colitis Mother    Colon polyps Mother    Kidney cancer Mother    Heart disease Maternal Grandmother        myocardial infarction   Colon cancer Neg Hx    Breast cancer Neg Hx    Social History   Socioeconomic History   Marital status: Married    Spouse name: Evann Koelzer   Number of children: 2   Years of education: college   Highest education level: Associate degree: occupational, Scientist, product/process development, or vocational program  Occupational History   Occupation: IT trainer: SELF EMPLOYED  Tobacco Use   Smoking status: Never   Smokeless tobacco: Never  Vaping Use   Vaping status: Never Used  Substance and Sexual Activity   Alcohol use: No    Alcohol/week: 0.0 standard drinks of alcohol   Drug use: No   Sexual activity: Yes    Birth control/protection: Surgical  Other Topics Concern   Not on file  Social History Narrative   Not on file   Social Drivers of Health   Financial Resource Strain: Low Risk  (12/28/2023)   Overall Financial Resource Strain (CARDIA)    Difficulty of Paying Living Expenses: Not hard at all  Food Insecurity:  No Food Insecurity (12/28/2023)   Hunger Vital Sign    Worried About Running Out of Food in the Last Year: Never true    Ran Out of Food in the Last Year: Never true  Transportation Needs: No Transportation Needs (12/28/2023)   PRAPARE - Administrator, Civil Service (Medical): No    Lack of Transportation (Non-Medical): No  Physical Activity: Insufficiently Active (12/28/2023)   Exercise Vital Sign    Days of Exercise per Week: 2 days    Minutes of Exercise per Session: 20 min  Stress: No Stress Concern Present (12/28/2023)   [employer] of Occupational Health - Occupational Stress Questionnaire    Feeling of  Stress: Only a little  Social Connections: Socially Integrated (12/28/2023)   Social Connection and Isolation Panel    Frequency of Communication with Friends and Family: More than three times a week    Frequency of Social Gatherings with Friends and Family: More than three times a week    Attends Religious Services: More than 4 times per year    Active Member of [employer] or Organizations: Yes    Attends Engineer, structural: More than 4 times per year    Marital Status: Married     Review of Systems  Constitutional:  Negative for appetite change and unexpected weight change.  HENT:  Negative for congestion and sinus pressure.   Respiratory:  Negative for cough, chest tightness and shortness of breath.   Cardiovascular:  Negative for chest pain and palpitations.       No increased swelling.   Gastrointestinal:  Negative for abdominal pain, diarrhea, nausea and vomiting.  Genitourinary:  Negative for difficulty urinating and dysuria.  Musculoskeletal:  Negative for joint swelling and myalgias.       Pain - tail bone.   Skin:  Negative for color change and rash.  Neurological:  Negative for dizziness and headaches.  Psychiatric/Behavioral:  Negative for agitation and dysphoric mood.        Objective:     BP 128/70   Pulse 74   Resp 16   Ht 5' 6 (1.676 m)   Wt 248 lb (112.5 kg)   SpO2 98%   BMI 40.03 kg/m  Wt Readings from Last 3 Encounters:  01/01/24 248 lb (112.5 kg)  10/29/23 250 lb (113.4 kg)  10/15/23 254 lb (115.2 kg)    Physical Exam Vitals reviewed.  Constitutional:      General: She is not in acute distress.    Appearance: Normal appearance.  HENT:     Head: Normocephalic and atraumatic.     Right Ear: External ear normal.     Left Ear: External ear normal.     Mouth/Throat:     Pharynx: No oropharyngeal exudate or posterior oropharyngeal erythema.  Eyes:     General: No scleral icterus.       Right eye: No discharge.        Left eye: No  discharge.     Conjunctiva/sclera: Conjunctivae normal.  Neck:     Thyroid : No thyromegaly.  Cardiovascular:     Rate and Rhythm: Normal rate and regular rhythm.  Pulmonary:     Effort: No respiratory distress.     Breath sounds: Normal breath sounds. No wheezing.  Abdominal:     General: Bowel sounds are normal.     Palpations: Abdomen is soft.     Tenderness: There is no abdominal tenderness.  Musculoskeletal:  General: No swelling or tenderness.     Cervical back: Neck supple. No tenderness.     Comments: Incision site healing well.   Lymphadenopathy:     Cervical: No cervical adenopathy.  Skin:    Findings: No erythema or rash.  Neurological:     Mental Status: She is alert.  Psychiatric:        Mood and Affect: Mood normal.        Behavior: Behavior normal.         Outpatient Encounter Medications as of 01/01/2024  Medication Sig   ALPRAZolam  (XANAX ) 0.25 MG tablet Take 1 tablet (0.25 mg total) by mouth daily as needed for anxiety.   ibuprofen (ADVIL,MOTRIN) 200 MG tablet Take 800 mg by mouth every 6 (six) hours as needed for moderate pain (pain score 4-6).   levocetirizine (XYZAL ) 5 MG tablet Take 1 tablet (5 mg total) by mouth daily.   tirzepatide (ZEPBOUND) 10 MG/0.5ML Pen Inject 10 mg into the skin once a week.   venlafaxine  XR (EFFEXOR  XR) 75 MG 24 hr capsule Take 1 capsule (75 mg total) by mouth daily with breakfast.   [DISCONTINUED] aspirin  81 MG chewable tablet Chew 1 tablet (81 mg total) by mouth 2 (two) times daily.   [DISCONTINUED] gabapentin  (NEURONTIN ) 300 MG capsule Take 300 mg by mouth at bedtime as needed (pain).   [DISCONTINUED] methocarbamol  (ROBAXIN ) 500 MG tablet Take 1 tablet (500 mg total) by mouth every 6 (six) hours as needed for muscle spasms.   [DISCONTINUED] ondansetron  (ZOFRAN -ODT) 4 MG disintegrating tablet Take 1 tablet (4 mg total) by mouth every 8 (eight) hours as needed for nausea or vomiting.   [DISCONTINUED] oxyCODONE  (OXY  IR/ROXICODONE ) 5 MG immediate release tablet Take 1-2 tablets (5-10 mg total) by mouth every 6 (six) hours as needed for moderate pain (pain score 4-6) (pain score 4-6).   [DISCONTINUED] promethazine  (PHENERGAN ) 12.5 MG tablet Take 1 tablet (12.5 mg total) by mouth every 8 (eight) hours as needed for nausea or vomiting.   [DISCONTINUED] venlafaxine  XR (EFFEXOR  XR) 75 MG 24 hr capsule Take 1 capsule (75 mg total) by mouth daily with breakfast.   No facility-administered encounter medications on file as of 01/01/2024.     Lab Results  Component Value Date   WBC 10.6 (H) 01/01/2024   HGB 12.6 01/01/2024   HCT 39.0 01/01/2024   PLT 352.0 01/01/2024   GLUCOSE 89 01/01/2024   CHOL 232 (H) 05/18/2023   TRIG 157.0 (H) 05/18/2023   HDL 52.00 05/18/2023   LDLDIRECT 147.8 04/23/2012   LDLCALC 149 (H) 05/18/2023   ALT 16 01/01/2024   AST 12 01/01/2024   NA 140 01/01/2024   K 3.9 01/01/2024   CL 103 01/01/2024   CREATININE 0.60 01/01/2024   BUN 24 (H) 01/01/2024   CO2 28 01/01/2024   TSH 1.70 01/01/2024    DG Pelvis Portable Result Date: 10/29/2023 CLINICAL DATA:  Status post right hip replacement. EXAM: PORTABLE PELVIS 1-2 VIEWS COMPARISON:  None Available. FINDINGS: Right hip arthroplasty in expected alignment. No periprosthetic lucency or fracture. Recent postsurgical change includes air and edema in the soft tissues. IMPRESSION: Right hip arthroplasty without immediate postoperative complication. Electronically Signed   By: Andrea Gasman M.D.   On: 10/29/2023 11:12   DG HIP UNILAT WITH PELVIS 1V RIGHT Result Date: 10/29/2023 CLINICAL DATA:  Right hip replacement EXAM: DG HIP (WITH OR WITHOUT PELVIS) 1V RIGHT COMPARISON:  None Available. FLUOROSCOPY TIME:  Radiation Exposure Index (as provided  by the fluoroscopic device): 3.92 mGy If the device does not provide the exposure index: Fluoroscopy Time:  26 seconds Number of Acquired Images:  3 FINDINGS: Right hip prosthesis is noted in  satisfactory position. IMPRESSION: Status post right hip replacement Electronically Signed   By: Oneil Devonshire M.D.   On: 10/29/2023 09:48   DG C-Arm 1-60 Min-No Report Result Date: 10/29/2023 Fluoroscopy was utilized by the requesting physician.  No radiographic interpretation.       Assessment & Plan:  Encounter for screening mammogram for malignant neoplasm of breast -     3D Screening Mammogram, Left and Right; Future  Hypercholesterolemia Assessment & Plan: The 10-year ASCVD risk score (Arnett DK, et al., 2019) is: 2.9%   Values used to calculate the score:     Age: 63 years     Clincally relevant sex: Female     Is Non-Hispanic African American: No     Diabetic: No     Tobacco smoker: No     Systolic Blood Pressure: 128 mmHg     Is BP treated: No     HDL Cholesterol: 52 mg/dL     Total Cholesterol: 232 mg/dL  Continue diet and exercise. Follow lipid panel.   Orders: -     Basic metabolic panel with GFR -     CBC with Differential/Platelet -     Hepatic function panel -     TSH  Anemia, unspecified type Assessment & Plan: Discussed RLS. Check cbc and iron studies.   Orders: -     Vitamin B12 -     IBC + Ferritin  Foot pain, bilateral Assessment & Plan: Saw podiatry 12/30/23 - arthritis - top of foot bilaterally. 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 S/p kenalog  injection.    Tail bone pain Assessment & Plan: Discussed using gel cushion. Has f/u with ortho next week. Plans to discuss.    Hot flashes Assessment & Plan: Stable. Continue effexor .    Other orders -     Venlafaxine  HCl ER; Take 1 capsule (75 mg total) by mouth daily with breakfast.  Dispense: 90 capsule; Refill: 1     Allena Hamilton, MD

## 2024-01-04 ENCOUNTER — Ambulatory Visit: Payer: Self-pay | Admitting: Internal Medicine

## 2024-01-04 ENCOUNTER — Encounter: Payer: Self-pay | Admitting: Internal Medicine

## 2024-01-04 DIAGNOSIS — M533 Sacrococcygeal disorders, not elsewhere classified: Secondary | ICD-10-CM | POA: Insufficient documentation

## 2024-01-04 NOTE — Assessment & Plan Note (Signed)
 Discussed RLS. Check cbc and iron studies.

## 2024-01-04 NOTE — Assessment & Plan Note (Signed)
 The 10-year ASCVD risk score (Arnett DK, et al., 2019) is: 2.9%   Values used to calculate the score:     Age: 58 years     Clincally relevant sex: Female     Is Non-Hispanic African American: No     Diabetic: No     Tobacco smoker: No     Systolic Blood Pressure: 128 mmHg     Is BP treated: No     HDL Cholesterol: 52 mg/dL     Total Cholesterol: 232 mg/dL  Continue diet and exercise. Follow lipid panel.

## 2024-01-04 NOTE — Assessment & Plan Note (Signed)
 Discussed using gel cushion. Has f/u with ortho next week. Plans to discuss.

## 2024-01-04 NOTE — Assessment & Plan Note (Signed)
 Stable. Continue effexor.

## 2024-01-04 NOTE — Assessment & Plan Note (Signed)
 Saw podiatry 12/30/23 - arthritis - top of foot bilaterally. 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 S/p kenalog  injection.

## 2024-01-05 ENCOUNTER — Telehealth: Payer: Self-pay | Admitting: Internal Medicine

## 2024-01-05 NOTE — Telephone Encounter (Signed)
 Copied from CRM [id number]. Topic: General - Call Back - No Documentation >> Jan 05, 2024  3:37 PM Avram MATSU wrote: Reason for CRM: pt would like a callback from the providers nurse. Would like to know if she can give herself the b12 shots [phone] (M)

## 2024-01-06 ENCOUNTER — Ambulatory Visit

## 2024-01-06 ENCOUNTER — Ambulatory Visit (INDEPENDENT_AMBULATORY_CARE_PROVIDER_SITE_OTHER): Admitting: Orthopaedic Surgery

## 2024-01-06 ENCOUNTER — Encounter: Payer: Self-pay | Admitting: Orthopaedic Surgery

## 2024-01-06 DIAGNOSIS — G8929 Other chronic pain: Secondary | ICD-10-CM

## 2024-01-06 DIAGNOSIS — M25562 Pain in left knee: Secondary | ICD-10-CM | POA: Diagnosis not present

## 2024-01-06 DIAGNOSIS — E538 Deficiency of other specified B group vitamins: Secondary | ICD-10-CM

## 2024-01-06 DIAGNOSIS — Z96641 Presence of right artificial hip joint: Secondary | ICD-10-CM

## 2024-01-06 MED ORDER — LIDOCAINE HCL 1 % IJ SOLN
3.0000 mL | INTRAMUSCULAR | Status: AC | PRN
  Administered 2024-01-06: 3 mL

## 2024-01-06 MED ORDER — METHYLPREDNISOLONE ACETATE 40 MG/ML IJ SUSP
40.0000 mg | INTRAMUSCULAR | Status: AC | PRN
  Administered 2024-01-06: 40 mg via INTRA_ARTICULAR

## 2024-01-06 MED ORDER — CYANOCOBALAMIN 1000 MCG/ML IJ SOLN
1000.0000 ug | Freq: Once | INTRAMUSCULAR | Status: AC
Start: 2024-01-06 — End: 2024-01-06
  Administered 2024-01-06: 1000 ug via INTRAMUSCULAR

## 2024-01-06 NOTE — Progress Notes (Addendum)
 Pt received B12 injection in Left  deltoid. Pt tolerated it well with no complaints or concern   Pt asked if she could do her b12 at home, I showed her how to do administer and  told her I would inform Dr Glendia that she would like to do her own injection at home so she can send in rx to pharmacy

## 2024-01-06 NOTE — Progress Notes (Signed)
 The patient is now 10 weeks status post a right total hip arthroplasty.  She is only 58 years old.  She does report some pain over the IT band at the knee as well as at the hip itself on the lateral side but overall she is doing much better than she was before surgery.  Her preoperative x-rays showed severe end-stage bone-on-bone arthritis of the right hip with complete whiteout of the joint on x-rays.  Her left hip was normal.  She is having some left knee pain with some known arthritis in her left knee.  She does live an hour away so she is requesting if she could see us  today for a steroid injection in that knee.  It does hurt her on a chronic basis.  She has put more weight through that knee as she was dealing with severe right hip arthritis and pain and also recovering from surgery.  Her right operative hip moves smoothly and fluidly.  Her left knee does have some patellofemoral crepitation but no effusion.  She has painful range of motion of the left knee.  The right hip has pain over the trochanteric area and the IT band.  A lot of this will hopefully continue to resolve with time as she gets more active and is more balanced in her gait.  I agreed with placing a steroid injection in her left knee today and she tolerated this well.  She is not a diabetic.  All questions and concerns were answered and addressed.  Will see her back in 6 months with a standing AP pelvis and lateral of her right operative hip.  If there are issues before then she knows to let us  know.    Procedure Note  Patient: Jenny Stephenson             Date of Birth: 1965-10-27           MRN: [id number]             Visit Date: 01/06/2024  Procedures: Visit Diagnoses:  1. Status post total replacement of right hip   2. Chronic pain of left knee     Large Joint Inj: L knee on 01/06/2024 9:13 AM Indications: diagnostic evaluation and pain Details: 22 G 1.5 in needle, superolateral approach  Arthrogram:  No  Medications: 3 mL lidocaine  1 %; 40 mg methylPREDNISolone  acetate 40 MG/ML Outcome: tolerated well, no immediate complications Procedure, treatment alternatives, risks and benefits explained, specific risks discussed. Consent was given by the patient. Immediately prior to procedure a time out was called to verify the correct patient, procedure, equipment, support staff and site/side marked as required. Patient was prepped and draped in the usual sterile fashion.

## 2024-01-06 NOTE — Telephone Encounter (Signed)
 I am ok with her getting her B12 shots at home, but she will need to be taught how to do them and demonstrate to us  given properly.

## 2024-01-07 NOTE — Telephone Encounter (Signed)
 Called pt and she said she was showed how to do B-12 at nurse visit yesterday, and would be comfortable doing it at home.

## 2024-01-08 ENCOUNTER — Other Ambulatory Visit: Payer: Self-pay

## 2024-01-08 MED ORDER — CYANOCOBALAMIN 1000 MCG/ML IJ SOLN: 1000.0000 ug | INTRAMUSCULAR | 0 refills | Status: DC

## 2024-01-08 MED ORDER — SYRINGE/NEEDLE (DISP) 25G X 1" 3 ML MISC: 0 refills | Status: AC

## 2024-01-08 NOTE — Progress Notes (Signed)
 Order signed

## 2024-01-08 NOTE — Telephone Encounter (Signed)
 Already sent in

## 2024-01-08 NOTE — Telephone Encounter (Signed)
 Reviewed addended nurses note. Pt taught how to give herself B12 injections. Ok to send in rx for her to do B12 injections at home. She has received one B12 injection. She will need B12 1000mcg q week x 3 more weeks and then continue 1000mcg q month. Ok to send in rx for her to get her injections at home.

## 2024-01-08 NOTE — Telephone Encounter (Signed)
 Pended medication for PCP to sign off on.

## 2024-01-13 ENCOUNTER — Ambulatory Visit

## 2024-01-15 ENCOUNTER — Ambulatory Visit
Admission: RE | Admit: 2024-01-15 | Discharge: 2024-01-15 | Disposition: A | Discharge status: Home / Self Care | Source: Ambulatory Visit | Attending: Internal Medicine | Admitting: Internal Medicine

## 2024-01-15 DIAGNOSIS — Z1231 Encounter for screening mammogram for malignant neoplasm of breast: Secondary | ICD-10-CM | POA: Insufficient documentation

## 2024-01-20 ENCOUNTER — Ambulatory Visit

## 2024-01-27 ENCOUNTER — Ambulatory Visit

## 2024-01-27 ENCOUNTER — Encounter: Admitting: Orthopaedic Surgery

## 2024-02-23 ENCOUNTER — Other Ambulatory Visit

## 2024-03-09 ENCOUNTER — Ambulatory Visit: Admitting: Orthopaedic Surgery

## 2024-03-09 ENCOUNTER — Other Ambulatory Visit: Payer: Self-pay

## 2024-03-09 ENCOUNTER — Other Ambulatory Visit (INDEPENDENT_AMBULATORY_CARE_PROVIDER_SITE_OTHER): Payer: Self-pay

## 2024-03-09 VITALS — Wt 248.0 lb

## 2024-03-09 DIAGNOSIS — M1711 Unilateral primary osteoarthritis, right knee: Secondary | ICD-10-CM | POA: Diagnosis not present

## 2024-03-09 DIAGNOSIS — M25561 Pain in right knee: Secondary | ICD-10-CM

## 2024-03-09 DIAGNOSIS — M1712 Unilateral primary osteoarthritis, left knee: Secondary | ICD-10-CM

## 2024-03-09 DIAGNOSIS — M25562 Pain in left knee: Secondary | ICD-10-CM

## 2024-03-09 DIAGNOSIS — M170 Bilateral primary osteoarthritis of knee: Secondary | ICD-10-CM

## 2024-03-09 NOTE — Progress Notes (Signed)
 The patient is well-known to us .  She is 58 years old and is getting close to 5 months out from a right total hip arthroplasty to treat significant right hip pain and arthritis.  Since we have seen her we did place a steroid injection in her left knee secondary to pain but she continues to have worsening left knee pain and both of her knees are hurting quite a bit after she had a hard fall on her knees recently.  She has fallen a couple times now.  Examination of both knees today shows slight varus malalignment that is correctable.  The left knee is slightly warm but the right knee is not.  Both knees are ligamentously stable with full range of motion.  There is bruising around the tibial tubercle on the right knee.  X-rays of both knees were done today.  Her left knee does show severe tricompartment arthritis with varus malalignment, near bone-on-bone wear of the medial compartment and patellofemoral joint with osteophytes in all 3 compartments.  The right knee has no acute findings but moderate arthritis.  She is a good candidate for hyaluronic acid for her knees.  She has already been through a weight loss journey continues to lose weight.  She is going work on Dance movement psychotherapist exercises in the interim.  Again she has already had a steroid injection in her left knee that only temporize her pain.  I do feel that she is a good candidate for considering hyaluronic acid to treat the long-term pain from osteoarthritis.  She is willing to try this as well.  This patient is diagnosed with osteoarthritis of the knee(s).    Radiographs show evidence of joint space narrowing, osteophytes, subchondral sclerosis and/or subchondral cysts.  This patient has knee pain which interferes with functional and activities of daily living.    This patient has experienced inadequate response, adverse effects and/or intolerance with conservative treatments such as acetaminophen , NSAIDS, topical creams, physical therapy or  regular exercise, knee bracing and/or weight loss.   This patient has experienced inadequate response or has a contraindication to intra articular steroid injections for at least 3 months.   This patient is not scheduled to have a total knee replacement within 6 months of starting treatment with viscosupplementation.

## 2024-03-15 ENCOUNTER — Other Ambulatory Visit: Payer: Self-pay | Admitting: Internal Medicine

## 2024-03-15 ENCOUNTER — Other Ambulatory Visit (INDEPENDENT_AMBULATORY_CARE_PROVIDER_SITE_OTHER)

## 2024-03-15 DIAGNOSIS — D649 Anemia, unspecified: Secondary | ICD-10-CM

## 2024-03-15 LAB — CBC WITH DIFFERENTIAL/PLATELET
Basophils Absolute: 0.1 K/uL (ref 0.0–0.1)
Basophils Relative: 1.1 % (ref 0.0–3.0)
Eosinophils Absolute: 0.5 K/uL (ref 0.0–0.7)
Eosinophils Relative: 6.1 % — ABNORMAL HIGH (ref 0.0–5.0)
HCT: 39.4 % (ref 36.0–46.0)
Hemoglobin: 12.8 g/dL (ref 12.0–15.0)
Lymphocytes Relative: 34.8 % (ref 12.0–46.0)
Lymphs Abs: 2.7 K/uL (ref 0.7–4.0)
MCHC: 32.5 g/dL (ref 30.0–36.0)
MCV: 81.3 fl (ref 78.0–100.0)
Monocytes Absolute: 0.5 K/uL (ref 0.1–1.0)
Monocytes Relative: 6.1 % (ref 3.0–12.0)
Neutro Abs: 4 K/uL (ref 1.4–7.7)
Neutrophils Relative %: 51.9 % (ref 43.0–77.0)
Platelets: 308 K/uL (ref 150.0–400.0)
RBC: 4.84 Mil/uL (ref 3.87–5.11)
RDW: 16.3 % — ABNORMAL HIGH (ref 11.5–15.5)
WBC: 7.8 K/uL (ref 4.0–10.5)

## 2024-03-15 LAB — IBC + FERRITIN
Ferritin: 50.9 ng/mL (ref 10.0–291.0)
Iron: 76 ug/dL (ref 42–145)
Saturation Ratios: 22.2 % (ref 20.0–50.0)
TIBC: 341.6 ug/dL (ref 250.0–450.0)
Transferrin: 244 mg/dL (ref 212.0–360.0)

## 2024-03-15 NOTE — Progress Notes (Signed)
 Order placed for labs.

## 2024-03-16 ENCOUNTER — Ambulatory Visit: Payer: Self-pay | Admitting: Internal Medicine

## 2024-03-22 ENCOUNTER — Ambulatory Visit (INDEPENDENT_AMBULATORY_CARE_PROVIDER_SITE_OTHER)

## 2024-03-22 ENCOUNTER — Ambulatory Visit (INDEPENDENT_AMBULATORY_CARE_PROVIDER_SITE_OTHER): Admitting: Internal Medicine

## 2024-03-22 ENCOUNTER — Ambulatory Visit: Payer: Self-pay | Admitting: Internal Medicine

## 2024-03-22 ENCOUNTER — Encounter: Payer: Self-pay | Admitting: Internal Medicine

## 2024-03-22 VITALS — BP 122/80 | HR 68 | Temp 98.0°F | Ht 67.0 in | Wt 246.8 lb

## 2024-03-22 DIAGNOSIS — E7800 Pure hypercholesterolemia, unspecified: Secondary | ICD-10-CM | POA: Diagnosis not present

## 2024-03-22 DIAGNOSIS — Z0000 Encounter for general adult medical examination without abnormal findings: Secondary | ICD-10-CM

## 2024-03-22 DIAGNOSIS — D649 Anemia, unspecified: Secondary | ICD-10-CM | POA: Diagnosis not present

## 2024-03-22 DIAGNOSIS — N644 Mastodynia: Secondary | ICD-10-CM

## 2024-03-22 DIAGNOSIS — M79644 Pain in right finger(s): Secondary | ICD-10-CM

## 2024-03-22 DIAGNOSIS — M79671 Pain in right foot: Secondary | ICD-10-CM

## 2024-03-22 MED ORDER — LEVOCETIRIZINE DIHYDROCHLORIDE 5 MG PO TABS
5.0000 mg | ORAL_TABLET | Freq: Every day | ORAL | 3 refills | Status: AC
Start: 2024-03-22 — End: ?

## 2024-03-22 MED ORDER — VENLAFAXINE HCL ER 75 MG PO CP24: 75.0000 mg | ORAL_CAPSULE | Freq: Every day | ORAL | 1 refills | Status: AC

## 2024-03-22 MED ORDER — ALPRAZOLAM 0.25 MG PO TABS: 0.2500 mg | ORAL_TABLET | Freq: Every day | ORAL | 0 refills | Status: AC | PRN

## 2024-03-22 NOTE — Progress Notes (Signed)
 Subjective:    Patient ID: Jenny Stephenson, female    DOB: [birth year], 58 y.o.   MRN: [id number]  Patient here for  Chief Complaint  Patient presents with   Annual Exam   Fall    HPI Here for a physical exam.  She is s/p right total hip arthroplasty 10/29/23.  Had f/u 11/16/23 -with ortho - s/p aspiration of seroma - hip area. Also had f/u 12/02/23 - s/p aspiration of seroma. Saw podiatry 12/30/23 - arthritis - top of foot bilaterally. 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 S/p kenalog  injection. She is doing well s/p her surgery. Had f/u 01/06/24 - ortho. S/p left knee injection. Had f/u 03/09/24 - worsening knee pain. Recent falls. Recommended gel injection. Also discussed total knee replacement. Ortho does not feel knee surgery warranted at this time. Has had persistent foot pain - right - s/p injury. (Right foot/great toe). Some persistent thumb pain. Discussed thumb spica. Also reports left breast tenderness. Noticed over the past week. Feels may be related to the positioning she is in - getting in and out of the bathtub. No chest pain. Breathing stable.    Past Medical History:  Diagnosis Date   Anxiety    Arthritis    Carpal tunnel syndrome    bilateral   Hypertension    Obesity    PONV (postoperative nausea and vomiting)    Sciatica of left side 02/28/2014   Past Surgical History:  Procedure Laterality Date   arm surgery  06/02/1997   screws/plates   CARPAL TUNNEL RELEASE Right 06/02/2006   CESAREAN SECTION  1993 and 2001   cholescystectomy  06/02/1993   COLONOSCOPY WITH PROPOFOL  N/A 07/24/2017   Procedure: COLONOSCOPY WITH PROPOFOL ;  Surgeon: Jinny Carmine, MD;  Location: [facility];  Service: Endoscopy;  Laterality: N/A;   FRACTURE SURGERY Left    arm   POLYPECTOMY  07/24/2017   Procedure: POLYPECTOMY;  Surgeon: Jinny Carmine, MD;  Location: [facility];  Service: Endoscopy;;   TONSILLECTOMY  06/03/1971    TOTAL HIP ARTHROPLASTY Right 10/29/2023   Procedure: ARTHROPLASTY, HIP, TOTAL, ANTERIOR APPROACH;  Surgeon: Vernetta Lonni GRADE, MD;  Location: [facility] OR;  Service: Orthopedics;  Laterality: Right;   VAGINAL HYSTERECTOMY  06/02/2002   ovaries not removed, secondary to fibroids and bleeding   Family History  Problem Relation Age of Onset   Prostate cancer Father    Lymphoma Father    Hypertension Mother    Ulcerative colitis Mother    Colon polyps Mother    Kidney cancer Mother    Heart disease Maternal Grandmother        myocardial infarction   Colon cancer Neg Hx    Breast cancer Neg Hx    Social History   Socioeconomic History   Marital status: Married    Spouse name: Asiyah Pineau   Number of children: 2   Years of education: college   Highest education level: Associate degree: occupational, scientist, product/process development, or vocational program  Occupational History   Occupation: It Trainer: SELF EMPLOYED  Tobacco Use   Smoking status: Never   Smokeless tobacco: Never  Vaping Use   Vaping status: Never Used  Substance and Sexual Activity   Alcohol use: No    Alcohol/week: 0.0 standard drinks of alcohol   Drug use: No   Sexual activity: Yes    Birth control/protection: Surgical  Other Topics Concern   Not on file  Social History  Narrative   Not on file   Social Drivers of Health   Financial Resource Strain: Low Risk  (03/18/2024)   Overall Financial Resource Strain (CARDIA)    Difficulty of Paying Living Expenses: Not hard at all  Food Insecurity: No Food Insecurity (03/18/2024)   Hunger Vital Sign    Worried About Running Out of Food in the Last Year: Never true    Ran Out of Food in the Last Year: Never true  Transportation Needs: No Transportation Needs (03/18/2024)   PRAPARE - Administrator, Civil Service (Medical): No    Lack of Transportation (Non-Medical): No  Physical Activity: Insufficiently Active (03/18/2024)   Exercise Vital Sign     Days of Exercise per Week: 2 days    Minutes of Exercise per Session: 20 min  Stress: Stress Concern Present (03/18/2024)   [employer] of Occupational Health - Occupational Stress Questionnaire    Feeling of Stress: To some extent  Social Connections: Socially Integrated (03/18/2024)   Social Connection and Isolation Panel    Frequency of Communication with Friends and Family: More than three times a week    Frequency of Social Gatherings with Friends and Family: More than three times a week    Attends Religious Services: More than 4 times per year    Active Member of [employer] or Organizations: Yes    Attends Engineer, Structural: More than 4 times per year    Marital Status: Married     Review of Systems  Constitutional:  Negative for appetite change and unexpected weight change.  HENT:  Negative for congestion and sinus pressure.   Respiratory:  Negative for cough, chest tightness and shortness of breath.   Cardiovascular:  Negative for chest pain, palpitations and leg swelling.  Gastrointestinal:  Negative for abdominal pain, diarrhea, nausea and vomiting.  Genitourinary:  Negative for difficulty urinating and dysuria.  Musculoskeletal:  Negative for myalgias.       Foot pain as outlined.   Skin:  Negative for color change and rash.  Neurological:  Negative for dizziness and headaches.  Psychiatric/Behavioral:  Negative for agitation and dysphoric mood.        Objective:     BP 122/80 (BP Location: Left Arm, Patient Position: Sitting, Cuff Size: Normal)   Pulse 68   Temp 98 F (36.7 C) (Oral)   Ht 5' 7 (1.702 m)   Wt 246 lb 12.8 oz (111.9 kg)   SpO2 96%   BMI 38.65 kg/m  Wt Readings from Last 3 Encounters:  03/22/24 246 lb 12.8 oz (111.9 kg)  03/09/24 248 lb (112.5 kg)  01/01/24 248 lb (112.5 kg)    Physical Exam Vitals reviewed.  Constitutional:      General: She is not in acute distress.    Appearance: Normal appearance.  HENT:     Head:  Normocephalic and atraumatic.     Right Ear: External ear normal.     Left Ear: External ear normal.     Mouth/Throat:     Pharynx: No oropharyngeal exudate or posterior oropharyngeal erythema.  Eyes:     General: No scleral icterus.       Right eye: No discharge.        Left eye: No discharge.     Conjunctiva/sclera: Conjunctivae normal.  Neck:     Thyroid : No thyromegaly.  Cardiovascular:     Rate and Rhythm: Normal rate and regular rhythm.  Pulmonary:     Effort: No respiratory  distress.     Breath sounds: Normal breath sounds. No wheezing.     Comments: Breasts- no nipple discharge or nipple retraction present. Could not appreciate distinct nodule or axillary adenopathy to be present. Some increased tenderness to palpation - left breast - no mass.  Abdominal:     General: Bowel sounds are normal.     Palpations: Abdomen is soft.     Tenderness: There is no abdominal tenderness.  Musculoskeletal:     Cervical back: Neck supple. No tenderness.     Comments: Increased pain - right foot.   Lymphadenopathy:     Cervical: No cervical adenopathy.  Skin:    Findings: No erythema or rash.  Neurological:     Mental Status: She is alert.  Psychiatric:        Mood and Affect: Mood normal.        Behavior: Behavior normal.         Outpatient Encounter Medications as of 03/22/2024  Medication Sig   cyanocobalamin  (VITAMIN B12) 1000 MCG/ML injection Inject 1 mL (1,000 mcg total) into the muscle as directed. Inject 1 mL (1,000 mcg total) into the muscle once a week X 3 weeks, THEN 1 mL (1,000 mcg total) every 30 (thirty) days.   ibuprofen (ADVIL,MOTRIN) 200 MG tablet Take 800 mg by mouth every 6 (six) hours as needed for moderate pain (pain score 4-6).   MOUNJARO 12.5 MG/0.5ML Pen    SYRINGE-NEEDLE, DISP, 3 ML 25G X 1 3 ML MISC 1000mcg q week x 3 weeks and then 1000mcg q month   [DISCONTINUED] ALPRAZolam  (XANAX ) 0.25 MG tablet Take 1 tablet (0.25 mg total) by mouth daily as needed  for anxiety.   ALPRAZolam  (XANAX ) 0.25 MG tablet Take 1 tablet (0.25 mg total) by mouth daily as needed for anxiety.   levocetirizine (XYZAL ) 5 MG tablet Take 1 tablet (5 mg total) by mouth daily.   venlafaxine  XR (EFFEXOR  XR) 75 MG 24 hr capsule Take 1 capsule (75 mg total) by mouth daily with breakfast.   [DISCONTINUED] levocetirizine (XYZAL ) 5 MG tablet Take 1 tablet (5 mg total) by mouth daily.   [DISCONTINUED] tirzepatide (ZEPBOUND) 10 MG/0.5ML Pen Inject 10 mg into the skin once a week.   [DISCONTINUED] venlafaxine  XR (EFFEXOR  XR) 75 MG 24 hr capsule Take 1 capsule (75 mg total) by mouth daily with breakfast.   No facility-administered encounter medications on file as of 03/22/2024.     Lab Results  Component Value Date   WBC 7.8 03/15/2024   HGB 12.8 03/15/2024   HCT 39.4 03/15/2024   PLT 308.0 03/15/2024   GLUCOSE 89 01/01/2024   CHOL 232 (H) 05/18/2023   TRIG 157.0 (H) 05/18/2023   HDL 52.00 05/18/2023   LDLDIRECT 147.8 04/23/2012   LDLCALC 149 (H) 05/18/2023   ALT 16 01/01/2024   AST 12 01/01/2024   NA 140 01/01/2024   K 3.9 01/01/2024   CL 103 01/01/2024   CREATININE 0.60 01/01/2024   BUN 24 (H) 01/01/2024   CO2 28 01/01/2024   TSH 1.70 01/01/2024    MM 3D SCREENING MAMMOGRAM BILATERAL BREAST Result Date: 01/20/2024 CLINICAL DATA:  Screening. EXAM: DIGITAL SCREENING BILATERAL MAMMOGRAM WITH TOMOSYNTHESIS AND CAD TECHNIQUE: Bilateral screening digital craniocaudal and mediolateral oblique mammograms were obtained. Bilateral screening digital breast tomosynthesis was performed. The images were evaluated with computer-aided detection. COMPARISON:  Previous exam(s). ACR Breast Density Category b: There are scattered areas of fibroglandular density. FINDINGS: There are no findings suspicious for malignancy.  IMPRESSION: No mammographic evidence of malignancy. A result letter of this screening mammogram will be mailed directly to the patient. RECOMMENDATION: Screening mammogram  in one year. (Code:SM-B-01Y) BI-RADS CATEGORY  1: Negative. Electronically Signed   By: Dina  Arceo M.D.   On: 01/20/2024 05:08       Assessment & Plan:  Right foot pain Assessment & Plan: Persistent. S/p injury. Will xray.  Orders: -     DG Foot 2 Views Right; Future  Anemia, unspecified type Assessment & Plan: Check cbc.   Orders: -     CBC with Differential/Platelet; Future -     IBC + Ferritin; Future -     Vitamin B12; Future  Hypercholesterolemia Assessment & Plan: The 10-year ASCVD risk score (Arnett DK, et al., 2019) is: 2.7%   Values used to calculate the score:     Age: 19 years     Clincally relevant sex: Female     Is Non-Hispanic African American: No     Diabetic: No     Tobacco smoker: No     Systolic Blood Pressure: 122 mmHg     Is BP treated: No     HDL Cholesterol: 52 mg/dL     Total Cholesterol: 232 mg/dL Continue diet and exercise. Follow lipid panel.   Orders: -     Hepatic function panel; Future -     Basic metabolic panel with GFR; Future -     Lipid panel; Future  Healthcare maintenance Assessment & Plan: Physical today 03/23/24. PAP 08/12/19 - negative with negative HPV.  Repeat pap 03/16/23 negative with negative HPV.  Mammogram 01/20/24 - Birads I. colonoscopy 07/2017.  Recommended f/u colonoscopy in 10 years.    Thumb pain, right Assessment & Plan: Discussed thumb spica. Follow.    Breast tenderness Assessment & Plan: Recent mammogram as outlined. Exam as outlined. Question if related to muscle strain. She is going to adjust her positioning - getting int the shower. Follow.  Call with update.    Other orders -     Levocetirizine Dihydrochloride ; Take 1 tablet (5 mg total) by mouth daily.  Dispense: 90 tablet; Refill: 3 -     Venlafaxine  HCl ER; Take 1 capsule (75 mg total) by mouth daily with breakfast.  Dispense: 90 capsule; Refill: 1 -     ALPRAZolam ; Take 1 tablet (0.25 mg total) by mouth daily as needed for anxiety.  Dispense: 30  tablet; Refill: 0     Allena Hamilton, MD

## 2024-03-27 ENCOUNTER — Encounter: Payer: Self-pay | Admitting: Internal Medicine

## 2024-03-27 DIAGNOSIS — M79644 Pain in right finger(s): Secondary | ICD-10-CM | POA: Insufficient documentation

## 2024-03-27 DIAGNOSIS — N644 Mastodynia: Secondary | ICD-10-CM | POA: Insufficient documentation

## 2024-03-27 NOTE — Assessment & Plan Note (Signed)
 Physical today 03/23/24. PAP 08/12/19 - negative with negative HPV.  Repeat pap 03/16/23 negative with negative HPV.  Mammogram 01/20/24 - [facility] colonoscopy 07/2017.  Recommended f/u colonoscopy in 10 years.

## 2024-03-27 NOTE — Assessment & Plan Note (Signed)
 Discussed thumb spica. Follow.

## 2024-03-27 NOTE — Assessment & Plan Note (Signed)
 Recent mammogram as outlined. Exam as outlined. Question if related to muscle strain. She is going to adjust her positioning - getting int the shower. Follow.  Call with update.

## 2024-03-27 NOTE — Assessment & Plan Note (Signed)
 The 10-year ASCVD risk score (Arnett DK, et al., 2019) is: 2.7%   Values used to calculate the score:     Age: 58 years     Clincally relevant sex: Female     Is Non-Hispanic African American: No     Diabetic: No     Tobacco smoker: No     Systolic Blood Pressure: 122 mmHg     Is BP treated: No     HDL Cholesterol: 52 mg/dL     Total Cholesterol: 232 mg/dL Continue diet and exercise. Follow lipid panel.

## 2024-03-27 NOTE — Assessment & Plan Note (Signed)
 Check cbc

## 2024-03-27 NOTE — Assessment & Plan Note (Signed)
 Persistent. S/p injury. Will xray.

## 2024-04-04 ENCOUNTER — Ambulatory Visit (INDEPENDENT_AMBULATORY_CARE_PROVIDER_SITE_OTHER): Admitting: Physician Assistant

## 2024-04-04 ENCOUNTER — Encounter: Payer: Self-pay | Admitting: Physician Assistant

## 2024-04-04 ENCOUNTER — Encounter: Payer: Self-pay | Admitting: Radiology

## 2024-04-04 DIAGNOSIS — M170 Bilateral primary osteoarthritis of knee: Secondary | ICD-10-CM | POA: Diagnosis not present

## 2024-04-04 DIAGNOSIS — M1711 Unilateral primary osteoarthritis, right knee: Secondary | ICD-10-CM

## 2024-04-04 DIAGNOSIS — M1712 Unilateral primary osteoarthritis, left knee: Secondary | ICD-10-CM

## 2024-04-04 MED ORDER — HYALURONAN 30 MG/2ML IX SOSY
30.0000 mg | PREFILLED_SYRINGE | INTRA_ARTICULAR | Status: AC | PRN
  Administered 2024-04-04: 30 mg via INTRA_ARTICULAR

## 2024-04-04 MED ORDER — LIDOCAINE HCL 1 % IJ SOLN
3.0000 mL | INTRAMUSCULAR | Status: AC | PRN
  Administered 2024-04-04: 3 mL

## 2024-04-04 NOTE — Progress Notes (Signed)
   Procedure Note  Patient: Jenny Stephenson             Date of Birth: 10-24-1965           MRN: [id number]             Visit Date: 04/04/2024 HPI: Jenny Stephenson comes in today for scheduled Orthovisc injections bilateral knees.  She has known osteoarthritis both knees.  She has worked on dance movement psychotherapist.  She has also tried over-the-counter medications without adequate relief.  She has tried steroid injections that only temporize her pain.  Physical exam: Bilateral knees no abnormal warmth erythema or effusion.  Good range of motion of both knees but painful range of motion left knee.  Procedures: Visit Diagnoses:  1. Unilateral primary osteoarthritis, left knee   2. Unilateral primary osteoarthritis, right knee     Large Joint Inj: bilateral knee on 04/04/2024 1:56 PM Indications: pain Details: 22 G 1.5 in needle, anterolateral approach  Arthrogram: No  Medications (Right): 30 mg Hyaluronan 30 MG/2ML Medications (Left): 3 mL lidocaine  1 %; 30 mg Hyaluronan 30 MG/2ML Outcome: tolerated well, no immediate complications Procedure, treatment alternatives, risks and benefits explained, specific risks discussed. Consent was given by the patient. Immediately prior to procedure a time out was called to verify the correct patient, procedure, equipment, support staff and site/side marked as required. Patient was prepped and draped in the usual sterile fashion.    Plan: Will see her back in 1 week for second Orthovisc injections.

## 2024-04-11 ENCOUNTER — Ambulatory Visit (INDEPENDENT_AMBULATORY_CARE_PROVIDER_SITE_OTHER): Admitting: Physician Assistant

## 2024-04-11 DIAGNOSIS — M1712 Unilateral primary osteoarthritis, left knee: Secondary | ICD-10-CM

## 2024-04-11 DIAGNOSIS — M170 Bilateral primary osteoarthritis of knee: Secondary | ICD-10-CM | POA: Diagnosis not present

## 2024-04-11 DIAGNOSIS — M1711 Unilateral primary osteoarthritis, right knee: Secondary | ICD-10-CM

## 2024-04-11 MED ORDER — HYALURONAN 30 MG/2ML IX SOSY
30.0000 mg | PREFILLED_SYRINGE | INTRA_ARTICULAR | Status: AC | PRN
  Administered 2024-04-11: 30 mg via INTRA_ARTICULAR

## 2024-04-11 MED ORDER — LIDOCAINE HCL 1 % IJ SOLN
3.0000 mL | INTRAMUSCULAR | Status: AC | PRN
  Administered 2024-04-11: 3 mL

## 2024-04-11 NOTE — Progress Notes (Signed)
   Procedure Note  Patient: Jenny Stephenson             Date of Birth: 1965-07-05           MRN: [id number]             Visit Date: 04/11/2024 HPI: Mrs. Rottmann returns today for second set of bilateral knee viscosupplementation injections.  She tolerated the injections well last time but felt that the lidocaine  helped greatly with the 1 knee that we injected and is asking for lidocaine  again today.  Bilateral knees: Good range of motion no abnormal warmth erythema or effusion  Procedures: Visit Diagnoses:  1. Unilateral primary osteoarthritis, left knee   2. Unilateral primary osteoarthritis, right knee     Large Joint Inj: bilateral knee on 04/11/2024 10:25 AM Indications: pain Details: 22 G 1.5 in needle, anterolateral approach  Arthrogram: No  Medications (Right): 3 mL lidocaine  1 %; 30 mg Hyaluronan 30 MG/2ML Medications (Left): 3 mL lidocaine  1 %; 30 mg Hyaluronan 30 MG/2ML Outcome: tolerated well, no immediate complications Procedure, treatment alternatives, risks and benefits explained, specific risks discussed. Consent was given by the patient. Immediately prior to procedure a time out was called to verify the correct patient, procedure, equipment, support staff and site/side marked as required. Patient was prepped and draped in the usual sterile fashion.    Plan: Will see her back in 1 week for her scheduled Orthovisc injections both knees.  She tolerated the injections well today.

## 2024-04-13 ENCOUNTER — Encounter: Payer: Self-pay | Admitting: Internal Medicine

## 2024-04-13 ENCOUNTER — Ambulatory Visit: Payer: Self-pay | Admitting: Podiatry

## 2024-04-13 DIAGNOSIS — M19072 Primary osteoarthritis, left ankle and foot: Secondary | ICD-10-CM

## 2024-04-13 DIAGNOSIS — M19071 Primary osteoarthritis, right ankle and foot: Secondary | ICD-10-CM

## 2024-04-13 DIAGNOSIS — M25569 Pain in unspecified knee: Secondary | ICD-10-CM | POA: Insufficient documentation

## 2024-04-13 NOTE — Progress Notes (Addendum)
 She presents today states that her feet are hurting top bilaterally and mid arch left.  She states that the injections really just did not seem to help at all last time.  Objective: Vital signs are stable alert and oriented x 3 pes planovalgus with severe osteoarthritic change and fat atrophy medial longitudinal arch resulting in prominent tarsometatarsal joints dorsally and plantarly.  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  Plan: Injected osteoarthritis bilateral foot with triamcinolone  and dexamethasone  mixture and local anesthetic.  She is cholesterol wearing her old orthotics that she has at home.  I did recommend that she consider getting a new pair of orthotics made.  I feel that an orthotic with possibly cork and a soft top-cover with a moderate arch may be of benefit to help open the dorsal joints of the midfoot.

## 2024-04-15 ENCOUNTER — Ambulatory Visit

## 2024-04-15 DIAGNOSIS — M2142 Flat foot [pes planus] (acquired), left foot: Secondary | ICD-10-CM | POA: Diagnosis not present

## 2024-04-15 DIAGNOSIS — M19071 Primary osteoarthritis, right ankle and foot: Secondary | ICD-10-CM

## 2024-04-15 DIAGNOSIS — M7751 Other enthesopathy of right foot: Secondary | ICD-10-CM

## 2024-04-15 DIAGNOSIS — M19072 Primary osteoarthritis, left ankle and foot: Secondary | ICD-10-CM

## 2024-04-15 DIAGNOSIS — G5791 Unspecified mononeuropathy of right lower limb: Secondary | ICD-10-CM

## 2024-04-15 DIAGNOSIS — M2141 Flat foot [pes planus] (acquired), right foot: Secondary | ICD-10-CM | POA: Diagnosis not present

## 2024-04-15 NOTE — Progress Notes (Signed)
  Orthotics   Patient was present and evaluated for Custom molded foot orthotics. Patient will benefit from CFO's to provide total contact to BIL MLA's helping to balance and distribute body weight more evenly across BIL feet helping to reduce plantar pressure and pain. Orthotic will also encourage FF / RF alignment  Patient was scanned today and will return for fitting upon receipt  [facility] base with neoprene top cover per Dr Verta will order from [facility]   Patient states oop max for year has been met  Charges added today  Lolita Schultze Cped

## 2024-04-18 ENCOUNTER — Ambulatory Visit: Admitting: Physician Assistant

## 2024-04-18 DIAGNOSIS — M170 Bilateral primary osteoarthritis of knee: Secondary | ICD-10-CM | POA: Diagnosis not present

## 2024-04-18 DIAGNOSIS — M1712 Unilateral primary osteoarthritis, left knee: Secondary | ICD-10-CM

## 2024-04-18 DIAGNOSIS — M1711 Unilateral primary osteoarthritis, right knee: Secondary | ICD-10-CM

## 2024-04-18 MED ORDER — HYALURONAN 30 MG/2ML IX SOSY
30.0000 mg | PREFILLED_SYRINGE | INTRA_ARTICULAR | Status: AC | PRN
  Administered 2024-04-18: 30 mg via INTRA_ARTICULAR

## 2024-04-18 MED ORDER — LIDOCAINE HCL 1 % IJ SOLN
3.0000 mL | INTRAMUSCULAR | Status: AC | PRN
  Administered 2024-04-18: 3 mL

## 2024-04-18 NOTE — Progress Notes (Signed)
 HPI: Ms. Caillouet returns today for her third Orthovisc injection in both knees.  She states that he started feeling little better and then began bothering her again.  Today overall doing well.  No real adverse effects to the injections either knee.  Physical exam: Bilateral knees good range of motion no abnormal warmth erythema or effusion    Procedure Note  Patient: Jenny Stephenson             Date of Birth: [birth year]           MRN: [id number]             Visit Date: 04/18/2024  Procedures: Visit Diagnoses: No diagnosis found.  Large Joint Inj: bilateral knee on 04/18/2024 5:14 PM Indications: pain Details: 22 G 1.5 in needle, anterolateral approach  Arthrogram: No  Medications (Right): 3 mL lidocaine  1 %; 30 mg Hyaluronan 30 MG/2ML Medications (Left): 3 mL lidocaine  1 %; 30 mg Hyaluronan 30 MG/2ML Outcome: tolerated well, no immediate complications Procedure, treatment alternatives, risks and benefits explained, specific risks discussed. Consent was given by the patient. Immediately prior to procedure a time out was called to verify the correct patient, procedure, equipment, support staff and site/side marked as required. Patient was prepped and draped in the usual sterile fashion.     Plan: She will follow-up with us  as needed.  She knows to wait least 6 months between viscosupplementation injections.  Questions were encouraged and answered.

## 2024-05-02 ENCOUNTER — Ambulatory Visit: Payer: Self-pay | Admitting: Podiatry

## 2024-05-04 ENCOUNTER — Other Ambulatory Visit: Payer: Self-pay | Admitting: Podiatry

## 2024-05-04 ENCOUNTER — Encounter: Payer: Self-pay | Admitting: Podiatry

## 2024-05-04 MED ORDER — CELECOXIB 200 MG PO CAPS: 200.0000 mg | ORAL_CAPSULE | Freq: Two times a day (BID) | ORAL | 1 refills | Status: DC

## 2024-05-16 ENCOUNTER — Encounter: Payer: Self-pay | Admitting: Orthopaedic Surgery

## 2024-05-16 ENCOUNTER — Ambulatory Visit: Admitting: Orthopaedic Surgery

## 2024-05-16 ENCOUNTER — Other Ambulatory Visit: Payer: Self-pay

## 2024-05-16 DIAGNOSIS — G8929 Other chronic pain: Secondary | ICD-10-CM

## 2024-05-16 DIAGNOSIS — Z96641 Presence of right artificial hip joint: Secondary | ICD-10-CM

## 2024-05-16 MED ORDER — LIDOCAINE HCL 1 % IJ SOLN
3.0000 mL | INTRAMUSCULAR | Status: AC | PRN
  Administered 2024-05-16: 10:00:00 3 mL

## 2024-05-16 MED ORDER — METHYLPREDNISOLONE ACETATE 40 MG/ML IJ SUSP
40.0000 mg | INTRAMUSCULAR | Status: AC | PRN
  Administered 2024-05-16: 10:00:00 40 mg via INTRA_ARTICULAR

## 2024-05-16 NOTE — Progress Notes (Signed)
 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  She states that the right hip is doing well.  She does have known arthritis in her left knee and is requesting steroid injections in both knees today.  She has had hyaluronic acid in both knees and that has not worked well for her.  It is mainly her left knee.  X-rays previously of the left knee and right knee shows bone-on-bone arthritis of the left knee mainly at the medial compartment and patellofemoral joint.  On exam her right hip moves smoothly and fluidly.  Both her knees have slight varus malalignment that is much worse on the left than the right knee.  The left knee has patellofemoral crepitation and medial joint line tenderness with varus malalignment that is correctable.  An AP pelvis and lateral of the right hip today shows a well-seated right total hip arthroplasty with no complicating features.  Per her request I did place a steroid injection of both knees that she tolerated well.  We will see her back in 3 months to see how she is doing overall but no x-rays are needed.  She may consider repeat steroid injections in her knees then.  She says her son is getting married in June and she wants to film good for the wedding.  She is not ready for knee replacement surgery as of yet.    Procedure Note  Patient: Jenny Stephenson             Date of Birth: [birth year]           MRN: [id number]             Visit Date: 05/16/2024  Procedures: Visit Diagnoses:  1. History of right hip replacement   2. Chronic pain of right knee   3. Chronic pain of left knee     Large Joint Inj: L knee on 05/16/2024 9:56 AM Indications: diagnostic evaluation and pain Details: 22 G 1.5 in needle, superolateral approach  Arthrogram: No  Medications: 3 mL lidocaine  1 %; 40 mg methylPREDNISolone  acetate 40 MG/ML Outcome: tolerated well, no immediate complications Procedure, treatment  alternatives, risks and benefits explained, specific risks discussed. Consent was given by the patient. Immediately prior to procedure a time out was called to verify the correct patient, procedure, equipment, support staff and site/side marked as required. Patient was prepped and draped in the usual sterile fashion.    Large Joint Inj: R knee on 05/16/2024 9:56 AM Indications: diagnostic evaluation and pain Details: 22 G 1.5 in needle, superolateral approach  Arthrogram: No  Medications: 3 mL lidocaine  1 %; 40 mg methylPREDNISolone  acetate 40 MG/ML Outcome: tolerated well, no immediate complications Procedure, treatment alternatives, risks and benefits explained, specific risks discussed. Consent was given by the patient. Immediately prior to procedure a time out was called to verify the correct patient, procedure, equipment, support staff and site/side marked as required. Patient was prepped and draped in the usual sterile fashion.

## 2024-05-17 ENCOUNTER — Telehealth: Payer: Self-pay

## 2024-05-17 NOTE — Telephone Encounter (Signed)
 Patient's orthotics are in and being sent to [facility].

## 2024-05-24 ENCOUNTER — Other Ambulatory Visit (INDEPENDENT_AMBULATORY_CARE_PROVIDER_SITE_OTHER)

## 2024-05-24 DIAGNOSIS — E7800 Pure hypercholesterolemia, unspecified: Secondary | ICD-10-CM | POA: Diagnosis not present

## 2024-05-24 DIAGNOSIS — D649 Anemia, unspecified: Secondary | ICD-10-CM

## 2024-05-24 LAB — HEPATIC FUNCTION PANEL
ALT: 15 U/L (ref 3–35)
AST: 13 U/L (ref 5–37)
Albumin: 4.2 g/dL (ref 3.5–5.2)
Alkaline Phosphatase: 82 U/L (ref 39–117)
Bilirubin, Direct: 0.1 mg/dL (ref 0.1–0.3)
Total Bilirubin: 0.7 mg/dL (ref 0.2–1.2)
Total Protein: 6.3 g/dL (ref 6.0–8.3)

## 2024-05-24 LAB — CBC WITH DIFFERENTIAL/PLATELET
Basophils Absolute: 0.1 K/uL (ref 0.0–0.1)
Basophils Relative: 1 % (ref 0.0–3.0)
Eosinophils Absolute: 0.4 K/uL (ref 0.0–0.7)
Eosinophils Relative: 3.6 % (ref 0.0–5.0)
HCT: 40 % (ref 36.0–46.0)
Hemoglobin: 13.2 g/dL (ref 12.0–15.0)
Lymphocytes Relative: 22.6 % (ref 12.0–46.0)
Lymphs Abs: 2.4 K/uL (ref 0.7–4.0)
MCHC: 32.9 g/dL (ref 30.0–36.0)
MCV: 83.7 fl (ref 78.0–100.0)
Monocytes Absolute: 0.6 K/uL (ref 0.1–1.0)
Monocytes Relative: 6 % (ref 3.0–12.0)
Neutro Abs: 7.1 K/uL (ref 1.4–7.7)
Neutrophils Relative %: 66.8 % (ref 43.0–77.0)
Platelets: 303 K/uL (ref 150.0–400.0)
RBC: 4.77 Mil/uL (ref 3.87–5.11)
RDW: 14.5 % (ref 11.5–15.5)
WBC: 10.6 K/uL — ABNORMAL HIGH (ref 4.0–10.5)

## 2024-05-24 LAB — LIPID PANEL
Cholesterol: 201 mg/dL — ABNORMAL HIGH (ref 28–200)
HDL: 52 mg/dL
LDL Cholesterol: 133 mg/dL — ABNORMAL HIGH (ref 10–99)
NonHDL: 148.75
Total CHOL/HDL Ratio: 4
Triglycerides: 78 mg/dL (ref 10.0–149.0)
VLDL: 15.6 mg/dL (ref 0.0–40.0)

## 2024-05-24 LAB — BASIC METABOLIC PANEL WITH GFR
BUN: 20 mg/dL (ref 6–23)
CO2: 29 meq/L (ref 19–32)
Calcium: 9.2 mg/dL (ref 8.4–10.5)
Chloride: 104 meq/L (ref 96–112)
Creatinine, Ser: 0.73 mg/dL (ref 0.40–1.20)
GFR: 91.01 mL/min
Glucose, Bld: 74 mg/dL (ref 70–99)
Potassium: 4.1 meq/L (ref 3.5–5.1)
Sodium: 140 meq/L (ref 135–145)

## 2024-05-25 LAB — IBC + FERRITIN
Ferritin: 40.2 ng/mL (ref 10.0–291.0)
Iron: 101 ug/dL (ref 42–145)
Saturation Ratios: 30.8 % (ref 20.0–50.0)
TIBC: 327.6 ug/dL (ref 250.0–450.0)
Transferrin: 234 mg/dL (ref 212.0–360.0)

## 2024-05-25 LAB — VITAMIN B12: Vitamin B-12: 716 pg/mL (ref 211–911)

## 2024-06-22 ENCOUNTER — Other Ambulatory Visit: Payer: Self-pay | Admitting: Internal Medicine

## 2024-07-01 ENCOUNTER — Other Ambulatory Visit: Payer: Self-pay | Admitting: Podiatry

## 2024-07-11 ENCOUNTER — Ambulatory Visit: Admitting: Orthopaedic Surgery

## 2024-08-11 ENCOUNTER — Ambulatory Visit: Admitting: Orthopaedic Surgery

## 2024-08-15 ENCOUNTER — Ambulatory Visit: Admitting: Physician Assistant

## 2024-09-20 ENCOUNTER — Ambulatory Visit: Admitting: Internal Medicine
